# Patient Record
Sex: Female | Born: 1960 | Race: Black or African American | Hispanic: No | Smoking: Never smoker
Health system: Southern US, Community
[De-identification: ages and names within clinical notes are randomized; demographics above are authoritative.]

## PROBLEM LIST (undated history)

## (undated) DIAGNOSIS — F329 Major depressive disorder, single episode, unspecified: Secondary | ICD-10-CM

## (undated) DIAGNOSIS — F419 Anxiety disorder, unspecified: Secondary | ICD-10-CM

## (undated) DIAGNOSIS — N393 Stress incontinence (female) (male): Secondary | ICD-10-CM

## (undated) DIAGNOSIS — F32A Depression, unspecified: Secondary | ICD-10-CM

## (undated) HISTORY — DX: Depression, unspecified: F32.A

## (undated) HISTORY — DX: Anxiety disorder, unspecified: F41.9

## (undated) HISTORY — DX: Stress incontinence (female) (male): N39.3

## (undated) HISTORY — DX: Major depressive disorder, single episode, unspecified: F32.9

---

## 2001-03-28 HISTORY — PX: MYOMECTOMY: SHX85

## 2008-06-20 ENCOUNTER — Ambulatory Visit (HOSPITAL_COMMUNITY): Admission: RE | Admit: 2008-06-20 | Discharge: 2008-06-20 | Payer: Self-pay | Admitting: Obstetrics and Gynecology

## 2009-06-23 ENCOUNTER — Ambulatory Visit (HOSPITAL_COMMUNITY): Admission: RE | Admit: 2009-06-23 | Discharge: 2009-06-23 | Payer: Self-pay | Admitting: Obstetrics and Gynecology

## 2010-08-02 ENCOUNTER — Other Ambulatory Visit (HOSPITAL_COMMUNITY)
Admission: RE | Admit: 2010-08-02 | Discharge: 2010-08-02 | Disposition: A | Discharge status: Home / Self Care | Payer: Managed Care, Other (non HMO) | Source: Ambulatory Visit | Attending: Obstetrics and Gynecology | Admitting: Obstetrics and Gynecology

## 2010-08-02 DIAGNOSIS — Z113 Encounter for screening for infections with a predominantly sexual mode of transmission: Secondary | ICD-10-CM | POA: Insufficient documentation

## 2010-08-02 DIAGNOSIS — Z124 Encounter for screening for malignant neoplasm of cervix: Secondary | ICD-10-CM | POA: Insufficient documentation

## 2011-06-15 ENCOUNTER — Telehealth: Payer: Self-pay | Admitting: Hematology and Oncology

## 2011-06-15 NOTE — Telephone Encounter (Signed)
Called pt re appt w/LO but she was in a meeting and not able to speak with me. Per pt she will call me back tomorrow. Pt was given my direct #.

## 2011-06-17 ENCOUNTER — Telehealth: Payer: Self-pay | Admitting: Hematology and Oncology

## 2011-06-17 NOTE — Telephone Encounter (Signed)
S/w pt re appt for 3/29 @ 9:30 am w/LO.

## 2011-06-21 ENCOUNTER — Telehealth: Payer: Self-pay | Admitting: Hematology and Oncology

## 2011-06-21 NOTE — Telephone Encounter (Signed)
Referred by Dr. Dorothyann Peng Dx- Anemia

## 2011-06-21 NOTE — Telephone Encounter (Signed)
Returned pt's call and r/s 3/29 new pt appt to 4/10 @ 1 pm. D/t per pt request.

## 2011-06-24 ENCOUNTER — Ambulatory Visit: Payer: Managed Care, Other (non HMO)

## 2011-06-24 ENCOUNTER — Ambulatory Visit: Payer: Managed Care, Other (non HMO) | Admitting: Hematology and Oncology

## 2011-07-06 ENCOUNTER — Other Ambulatory Visit: Payer: Self-pay | Admitting: *Deleted

## 2011-07-06 ENCOUNTER — Ambulatory Visit (HOSPITAL_BASED_OUTPATIENT_CLINIC_OR_DEPARTMENT_OTHER): Payer: Managed Care, Other (non HMO) | Admitting: Lab

## 2011-07-06 ENCOUNTER — Encounter: Payer: Self-pay | Admitting: Hematology and Oncology

## 2011-07-06 ENCOUNTER — Ambulatory Visit (HOSPITAL_BASED_OUTPATIENT_CLINIC_OR_DEPARTMENT_OTHER): Payer: Managed Care, Other (non HMO) | Admitting: Hematology and Oncology

## 2011-07-06 ENCOUNTER — Ambulatory Visit: Payer: Managed Care, Other (non HMO)

## 2011-07-06 VITALS — BP 122/75 | HR 75 | Temp 99.8°F | Ht 64.0 in | Wt 145.8 lb

## 2011-07-06 DIAGNOSIS — D539 Nutritional anemia, unspecified: Secondary | ICD-10-CM

## 2011-07-06 DIAGNOSIS — D219 Benign neoplasm of connective and other soft tissue, unspecified: Secondary | ICD-10-CM | POA: Insufficient documentation

## 2011-07-06 DIAGNOSIS — D509 Iron deficiency anemia, unspecified: Secondary | ICD-10-CM

## 2011-07-06 LAB — MORPHOLOGY: PLT EST: ADEQUATE

## 2011-07-06 LAB — COMPREHENSIVE METABOLIC PANEL
ALT: 8 U/L (ref 0–35)
AST: 19 U/L (ref 0–37)
Alkaline Phosphatase: 61 U/L (ref 39–117)
Calcium: 9.3 mg/dL (ref 8.4–10.5)
Chloride: 104 mEq/L (ref 96–112)
Creatinine, Ser: 0.7 mg/dL (ref 0.50–1.10)
Sodium: 139 mEq/L (ref 135–145)
Total Protein: 7.4 g/dL (ref 6.0–8.3)

## 2011-07-06 LAB — URINALYSIS, MICROSCOPIC - CHCC
Nitrite: NEGATIVE
pH: 6 (ref 4.6–8.0)

## 2011-07-06 LAB — CBC & DIFF AND RETIC
BASO%: 1.3 % (ref 0.0–2.0)
EOS%: 1.1 % (ref 0.0–7.0)
HCT: 26.2 % — ABNORMAL LOW (ref 34.8–46.6)
HGB: 7.5 g/dL — ABNORMAL LOW (ref 11.6–15.9)
Immature Retic Fract: 14.4 % — ABNORMAL HIGH (ref 1.60–10.00)
MCHC: 28.6 g/dL — ABNORMAL LOW (ref 31.5–36.0)
MCV: 63.4 fL — ABNORMAL LOW (ref 79.5–101.0)
MONO%: 8.9 % (ref 0.0–14.0)
NEUT#: 1.7 10*3/uL (ref 1.5–6.5)
Platelets: 247 10*3/uL (ref 145–400)
RBC: 4.13 10*6/uL (ref 3.70–5.45)
WBC: 3.7 10*3/uL — ABNORMAL LOW (ref 3.9–10.3)
lymph#: 1.6 10*3/uL (ref 0.9–3.3)

## 2011-07-06 MED ORDER — FE FUM-VIT C-VIT B12-FA 460-60-0.01-1 MG PO CAPS: 1.0000 | ORAL_CAPSULE | Freq: Every day | ORAL | Status: DC

## 2011-07-06 NOTE — Patient Instructions (Signed)
Patient to follow up as instructed.   No current outpatient prescriptions on file.        April 2013  Sunday Monday Tuesday Wednesday Thursday Friday Saturday      1   2   3   4   5   6    7   8   9   10    FINANCIAL COUNSELING   1:00 PM  (30 min.)  [facility]  [facility]   NEW PATIENT 60   1:30 PM  (60 min.)  Laurice Record, MD  [facility] 11   12   13    14   15   16   17   18   19   20    21   22   23   24   25   26   27    28   29    30

## 2011-07-06 NOTE — Progress Notes (Signed)
Dr.   Maggie Font    -      Primary. Dr.   Adron Bene       -      GYN.  [facility]   On    [address].  Cell   Phone       [phone]

## 2011-07-06 NOTE — Progress Notes (Signed)
This office note has been dictated.

## 2011-07-07 LAB — FERRITIN: Ferritin: 3 ng/mL — ABNORMAL LOW (ref 10–291)

## 2011-07-07 LAB — IRON AND TIBC: Iron: <10 ug/dL — ABNORMAL LOW (ref 42–145)

## 2011-07-07 LAB — VITAMIN B12: Vitamin B-12: 481 pg/mL (ref 211–911)

## 2011-07-07 LAB — DIRECT ANTIGLOBULIN TEST (NOT AT ARMC): DAT (Complement): NEGATIVE

## 2011-07-07 NOTE — Progress Notes (Signed)
CC:   Lindsey Cardenas, M.D. Dr. Marrian Salvage  IDENTIFYING STATEMENT:  The patient is a 51 year old woman, seen at the request of Dorothyann Peng with anemia.  HISTORY OF PRESENT ILLNESS:  The patient has recently established primary care with Dr. Allyne Cardenas and was found to be anemic.  The patient noted anemia dating back to 2007 and was found to be iron deficient. She also has a history of menorrhagia secondary to fibroids.  She had received a myomectomy in 2003.  Has been on and off oral iron and is not very consistent with it.  She has never required a blood transfusion. Despite her myomectomy, she continues have heavy menses which she is currently managing through alternative therapies.  She has no rectal bleeding.  She has not noted any bloody stools.  She has not noted any constipation, diarrhea, or recent changes in bowel function.  She has not lost any weight.  She denies hematuria.  Her diet she states is well- balanced.  Review of blood work obtained from Dr. Allyne Cardenas' office on 06/01/2015 notes a white cell count of 5.6, hemoglobin 8.5, hematocrit 18.9, platelets 306.  Ferritin 4, TIBC 561, iron 21, saturation 4.  PAST MEDICAL HISTORY: 1. Uterine fibroids. 2. Status post myomectomy in 2003. 3. History of pregnancy-induced hyperthyroidism. 4. History of false positive hepatitis B.  MEDICATIONS:  None.  ALLERGIES:  Penicillin.  SOCIAL HISTORY:  The patient is divorced with a 36-year-old child. Denies alcohol or tobacco use.  She is a Runner, broadcasting/film/video at [employer]  FAMILY HISTORY:  The patient's father had cancer of the prostate. Paternal grandmother may have had breast cancer.  Mother has anemia.  HEALTH MAINTENANCE:  Receives annual mammograms.  REVIEW OF SYSTEMS:  Denies fever, chills, night sweats, anorexia, pain. GI:  Denies nausea, vomiting, abdominal pain, diarrhea, melena, hematochezia.  GU:  Denies dysuria, hematuria, nocturia, frequency. Cardiovascular:  Denies  chest pain, PND, orthopnea, ankle swelling. Respirations:  Denies cough, hemoptysis, wheeze, shortness of breath. Skin:  No bruising or bleeding.  Musculoskeletal:  Denies joint aches, muscle pains.  Neurologic:  Denies headaches, vision changes, extremity weakness.  Rest of review of systems negative.  PHYSICAL EXAMINATION:  General:  The patient is alert and oriented x3. Vitals:  Pulse 75, blood pressure 122/75, temperature 99.8, respirations 20, weight 145 pounds.  HEENT:  Head is atraumatic, normocephalic. Sclerae anicteric.  Mouth moist without thrush.  Neck:  Supple.  Chest: Clear to percussion and auscultation bilaterally.  CVS:  First and second heart sounds present.  No added sounds or murmurs.  Abdomen: Soft and nontender.  No palpable masses.  No hepatosplenomegaly.  Bowel sounds present.  Extremities:  Reveal no edema.  Pulses present and symmetrical.  Skin:  No bruising.  CNS:  Nonfocal.  IMPRESSION AND PLAN:  Ms. Ikner is a 51 year old woman with iron- deficiency anemia, likely secondary to menorrhagia with history of fibroids.  She is not consistent with oral iron intake.  I have encouraged her to consider doing so and she concurs.  She will be given a prescription for Hematogen Forte with refills.  She has very low iron stores and I think she is a candidate for IV iron.  We will give it to her in the form of INFeD.  We discussed logistics and side effects of therapy, side effects of primary infusional and thus a test dose will be required, followed by premeds prior to the main infusion.  We also discussed the need for obtaining occult  fecal blood testing x3 and a urinalysis.  We will also rule out hemolysis with a Coombs and haptoglobin.  We will repeat a CBC and review morphology and obtain a liver panel.  Also discussed maintaining a well-balanced diet.  The patient wishes to receive iron on 08/02/2011 due to her busy schedule. She was asked that if she has symptoms  of increasing shortness of breath or lightheadedness, she would let Dr. Allyne Cardenas know about this.  She follows up in 6-8 weeks after IV iron.  I spent more than half the time coordinating care.    ______________________________ Laurice Record, M.D. LIO/MEDQ  D:  07/06/2011  T:  07/06/2011  Job:  [id number]

## 2011-07-11 ENCOUNTER — Telehealth: Payer: Self-pay | Admitting: Hematology and Oncology

## 2011-07-11 NOTE — Telephone Encounter (Signed)
pt aware of her 5/7 iron    aom

## 2011-08-01 ENCOUNTER — Telehealth: Payer: Self-pay | Admitting: *Deleted

## 2011-08-01 NOTE — Telephone Encounter (Signed)
Per voice mail message from the patient, she needs to cancel her appt for tomorrow. The patient needs to reschedule her appt. Message left for desk RN to see tomorrow. I have called and spoke with the patient.I have told her that Dr. Dalene Carrow is out of the office today, and we will call her tomorrow to reshedule appt once we spoke with Dr. Dalene Carrow. Message left on desk for RN tomorrow.   JMW

## 2011-08-02 ENCOUNTER — Ambulatory Visit: Payer: Managed Care, Other (non HMO)

## 2011-08-02 ENCOUNTER — Telehealth: Payer: Self-pay | Admitting: *Deleted

## 2011-08-02 NOTE — Telephone Encounter (Signed)
Per message from desk RN ok to move patient's infed treatment to next week. Appt moved and patient aware. JWM

## 2011-08-11 ENCOUNTER — Telehealth: Payer: Self-pay | Admitting: Hematology and Oncology

## 2011-08-11 NOTE — Telephone Encounter (Signed)
Pt lmonvm requesting that 5/17 appt be cx'd due to she is not feeling well and she will call back to r/s when she is feeling better. Message to desk nurse and Marcelino Duster.

## 2011-08-12 ENCOUNTER — Ambulatory Visit: Payer: Managed Care, Other (non HMO)

## 2011-08-12 ENCOUNTER — Telehealth: Payer: Self-pay | Admitting: *Deleted

## 2011-08-12 NOTE — Telephone Encounter (Signed)
Per voice mail message from the patient, she is sick. She canceled her appt for today and will call back to rescheduled. Desk RN notified.  JMW

## 2011-08-12 NOTE — Telephone Encounter (Signed)
Received message from Grandy, infusion scheduler re:  Pt cancelled  Infed infusion for today due to pt is sick.   Called pt at home and left message on voice mail for pt to call office to reschedule appt. Pt's   Phone    [phone].

## 2011-09-12 ENCOUNTER — Other Ambulatory Visit: Payer: Managed Care, Other (non HMO) | Admitting: Lab

## 2011-09-13 ENCOUNTER — Telehealth: Payer: Self-pay | Admitting: *Deleted

## 2011-09-13 NOTE — Telephone Encounter (Signed)
Spoke with pt and was informed that pt wanted to cancel f/u appt with md on 09/14/11.   Pt stated she had not done iron infusion,  Had not done repeated labs.   Pt stated she would like to reschedule iron infusion,  Have repeated labs, and f/u visit with md  Sometimes in  July  Or  August.    Informed pt to let office know when she would like to reschedule , then a scheduler will contact pt with md's instructions.   Pt voiced understanding.

## 2011-09-14 ENCOUNTER — Ambulatory Visit: Payer: Managed Care, Other (non HMO) | Admitting: Hematology and Oncology

## 2011-09-15 ENCOUNTER — Other Ambulatory Visit: Payer: Self-pay | Admitting: *Deleted

## 2011-09-15 ENCOUNTER — Telehealth: Payer: Self-pay | Admitting: *Deleted

## 2011-09-15 DIAGNOSIS — D539 Nutritional anemia, unspecified: Secondary | ICD-10-CM

## 2011-09-15 NOTE — Telephone Encounter (Signed)
Spoke with pt and informed pt re:   Per Dr. Dalene Carrow,  Pt will be scheduled for labs  In July.    MD will review lab results and will make decision whether pt will need iron infusion.   Pt will be contacted with a f/u appt if pt needs iron infusion.   Pt understood that a scheduler will contact pt with lab appt.

## 2011-09-16 ENCOUNTER — Telehealth: Payer: Self-pay | Admitting: Hematology and Oncology

## 2011-09-16 NOTE — Telephone Encounter (Signed)
S/w the pt and she is aware of her lab appt in July. Pt stated she will call me back to double check with her schedule to make sure the d/t works for her

## 2011-10-11 ENCOUNTER — Telehealth: Payer: Self-pay | Admitting: *Deleted

## 2011-10-11 ENCOUNTER — Other Ambulatory Visit: Payer: Managed Care, Other (non HMO) | Admitting: Lab

## 2011-10-11 ENCOUNTER — Other Ambulatory Visit (HOSPITAL_BASED_OUTPATIENT_CLINIC_OR_DEPARTMENT_OTHER): Payer: Managed Care, Other (non HMO) | Admitting: Lab

## 2011-10-11 DIAGNOSIS — D539 Nutritional anemia, unspecified: Secondary | ICD-10-CM

## 2011-10-11 DIAGNOSIS — D509 Iron deficiency anemia, unspecified: Secondary | ICD-10-CM

## 2011-10-11 LAB — IRON AND TIBC
Iron: 13 ug/dL — ABNORMAL LOW (ref 42–145)
TIBC: 435 ug/dL (ref 250–470)
UIBC: 422 ug/dL — ABNORMAL HIGH (ref 125–400)

## 2011-10-11 LAB — CBC WITH DIFFERENTIAL/PLATELET
Basophils Absolute: 0.1 10*3/uL (ref 0.0–0.1)
EOS%: 1.5 % (ref 0.0–7.0)
LYMPH%: 27.9 % (ref 14.0–49.7)
MCHC: 31.8 g/dL (ref 31.5–36.0)
MCV: 72.8 fL — ABNORMAL LOW (ref 79.5–101.0)
RBC: 4.7 10*6/uL (ref 3.70–5.45)
RDW: 20.2 % — ABNORMAL HIGH (ref 11.2–14.5)
WBC: 4.5 10*3/uL (ref 3.9–10.3)
lymph#: 1.2 10*3/uL (ref 0.9–3.3)

## 2011-10-11 LAB — BASIC METABOLIC PANEL
BUN: 11 mg/dL (ref 6–23)
Creatinine, Ser: 0.68 mg/dL (ref 0.50–1.10)
Glucose, Bld: 98 mg/dL (ref 70–99)
Sodium: 141 mEq/L (ref 135–145)

## 2011-10-11 LAB — FERRITIN: Ferritin: 5 ng/mL — ABNORMAL LOW (ref 10–291)

## 2011-10-11 NOTE — Telephone Encounter (Signed)
Spoke with pt on cell phone and gave pt appt date and time for f/u on 10/12/11 at 0915 am.   Pt is also tentatively scheduled for Feraheme infusion as per md.    Pt voiced understanding.

## 2011-10-11 NOTE — Telephone Encounter (Signed)
Spoke with pt on cell phone and informed pt of appt for Feraheme at 1015 am after office visit on 10/12/11.   Pt voiced understanding.

## 2011-10-12 ENCOUNTER — Telehealth: Payer: Self-pay | Admitting: Hematology and Oncology

## 2011-10-12 ENCOUNTER — Ambulatory Visit (HOSPITAL_BASED_OUTPATIENT_CLINIC_OR_DEPARTMENT_OTHER): Payer: Managed Care, Other (non HMO)

## 2011-10-12 ENCOUNTER — Ambulatory Visit (HOSPITAL_BASED_OUTPATIENT_CLINIC_OR_DEPARTMENT_OTHER): Payer: Managed Care, Other (non HMO) | Admitting: Nurse Practitioner

## 2011-10-12 ENCOUNTER — Encounter: Payer: Self-pay | Admitting: Nurse Practitioner

## 2011-10-12 VITALS — BP 115/65 | HR 70 | Temp 98.4°F | Ht 64.0 in | Wt 148.2 lb

## 2011-10-12 VITALS — BP 130/81 | HR 58 | Temp 98.5°F

## 2011-10-12 DIAGNOSIS — R58 Hemorrhage, not elsewhere classified: Secondary | ICD-10-CM

## 2011-10-12 DIAGNOSIS — D509 Iron deficiency anemia, unspecified: Secondary | ICD-10-CM

## 2011-10-12 MED ORDER — SODIUM CHLORIDE 0.9 % IV SOLN
1020.0000 mg | Freq: Once | INTRAVENOUS | Status: AC
  Administered 2011-10-12: 1020 mg via INTRAVENOUS
  Filled 2011-10-12: qty 34

## 2011-10-12 MED ORDER — SODIUM CHLORIDE 0.9 % IV SOLN
Freq: Once | INTRAVENOUS | Status: AC
  Administered 2011-10-12: 11:00:00 via INTRAVENOUS

## 2011-10-12 NOTE — Telephone Encounter (Signed)
appts made and mw will print for pt

## 2011-10-12 NOTE — Progress Notes (Signed)
Lindsey Cardenas cried before her feraheme injection. I do know why.

## 2011-10-12 NOTE — Progress Notes (Signed)
CC:   Robyn N. Allyne Gee, M.D. Dr. Marrian Salvage  IDENTIFYING STATEMENT:  The patient is a 51 year old woman, with iron deficiency anemia due to chronic blood loss.  HISTORY OF PRESENT ILLNESS:  The patient states she missed her appts in May due to a variety of factors, including her schedule and her decision to pursue acupuncture as an alternative treatment. She has grown accustomed to being anemic, and states she does eat a lot of ice, and is tired often and easily but has not had any dizziness. She has had no difficulties passing stool or urine. The patient has anemia dating back to 2007. She also has a history of menorrhagia secondary to fibroids.  She had received a myomectomy in 2003.  Has been on and off oral iron and is not very consistent with it.  She has never required a blood transfusion. Despite her myomectomy, she continues have heavy menses which she is currently managing through alternative therapies.  She has no rectal bleeding.  She has not noted any bloody stools.  She has not noted any constipation, diarrhea, or recent changes in bowel function.  She has not lost any weight.  She denies hematuria.  Her diet she states is well- balanced.  She was placed on prescription Hematogen Forte at her last office visit in April, but she states she has not taken it consistently because it is difficult to coordinate with meals and can make her feel "funny in the head." She asked about other iron supplements without any side effects but unfortunately essentially all of the oral supplements will have some sort of gastrointestinal side effect.     PAST MEDICAL HISTORY: 1. Uterine fibroids. 2. Status post myomectomy in 2003. 3. History of pregnancy-induced hyperthyroidism. 4. History of false positive hepatitis B.  MEDICATIONS:  None.  ALLERGIES:  Penicillin.  SOCIAL HISTORY:  The patient is divorced with a 30-year-old child. Denies alcohol or tobacco use.  She is a Runner, broadcasting/film/video at [employer].  FAMILY HISTORY:  The patient's father had cancer of the prostate. Paternal grandmother may have had breast cancer.  Mother has anemia.  HEALTH MAINTENANCE:  Receives annual mammograms.  REVIEW OF SYSTEMS:  Denies fever, chills, night sweats, anorexia, pain. GI:  Denies nausea, vomiting, abdominal pain, diarrhea, melena, hematochezia.  GU:  Denies dysuria, hematuria, nocturia, frequency. Cardiovascular:  Denies chest pain, PND, orthopnea, ankle swelling. Respirations:  Denies cough, hemoptysis, wheeze, shortness of breath. Skin:  No bruising or bleeding.  Musculoskeletal:  Denies joint aches, muscle pains.  Neurologic:  Denies headaches, vision changes, extremity weakness.  Rest of review of systems negative.  PHYSICAL EXAMINATION:  General:  The patient is alert and oriented x3. Vitals:  Filed Vitals:   10/12/11 0941  BP: 115/65  Pulse: 70  Temp: 98.4 F (36.9 C)    HEENT:  Head is atraumatic, normocephalic. Sclerae anicteric.  Mouth moist without thrush.  Neck:  Supple.  Chest: Clear to percussion and auscultation bilaterally.  CVS:  First and second heart sounds present.  No added sounds or murmurs.  Abdomen: Soft and nontender.  No palpable masses.  No hepatosplenomegaly.  Bowel sounds present.  Extremities:  Reveal no edema.  Pulses present and symmetrical.  Skin:  No bruising.  CNS:  Nonfocal.  LABS: WBC  Date/Time Value Range Status  10/11/2011  9:00 AM 4.5  3.9 - 10.3 10e3/uL Final     HGB  Date/Time Value Range Status  10/11/2011  9:00 AM 10.9* 11.6 - 15.9  g/dL Final     HCT  Date/Time Value Range Status  10/11/2011  9:00 AM 34.2* 34.8 - 46.6 % Final     Platelets  Date/Time Value Range Status  10/11/2011  9:00 AM 212  145 - 400 10e3/uL Final     Iron  Date/Time Value Range Status  10/11/2011  9:00 AM 13* 42 - 145 ug/dL Final     UIBC  Date/Time Value Range Status  10/11/2011  9:00 AM 422* 125 - 400 ug/dL Final     TIBC  Date/Time Value  Range Status  10/11/2011  9:00 AM 435  250 - 470 ug/dL Final   Ferritin  Date/Time Value Range Status  10/11/2011  9:00 AM 5* 10 - 291 ng/mL Final      IMPRESSION AND PLAN:  Ms. Caldwell is a 51 year old woman with iron- deficiency anemia, likely secondary to menorrhagia with history of fibroids. She will receive Feraheme today, and she was given a copy of her labs. She is not consistent with oral iron intake. We reviewed numerous options for oral supplementation and she prefers to increase her intake of high iron foods instead of trying additional supplements. She was strongly encouraged to incorporate high iron foods into her daily diet, (spinach, liver, red meat) - she is interested in alternative treatments and has had acupuncture. We encouraged herWe will see her back in November, with labs a few days prior to her scheduled office visit.   ______________________________ Bobbe Medico, NP-C, AOCNP

## 2011-10-12 NOTE — Patient Instructions (Addendum)
Cancer Center Discharge Instructions for Patients Receiving Chemotherapy  Today you received the following chemotherapy agents Feraheme  To help prevent nausea and vomiting after your treatment, we encourage you to take your nausea medication Begin taking it at 7 pm and take it as often as prescribed for the next 24 to 72 hours.   If you develop nausea and vomiting that is not controlled by your nausea medication, call the clinic. If it is after clinic hours your family physician or the after hours number for the clinic or go to the Emergency Department.   BELOW ARE SYMPTOMS THAT SHOULD BE REPORTED IMMEDIATELY:  *FEVER GREATER THAN 100.5 F  *CHILLS WITH OR WITHOUT FEVER  NAUSEA AND VOMITING THAT IS NOT CONTROLLED WITH YOUR NAUSEA MEDICATION  *UNUSUAL SHORTNESS OF BREATH  *UNUSUAL BRUISING OR BLEEDING  TENDERNESS IN MOUTH AND THROAT WITH OR WITHOUT PRESENCE OF ULCERS  *URINARY PROBLEMS  *BOWEL PROBLEMS  UNUSUAL RASH Items with * indicate a potential emergency and should be followed up as soon as possible.  One of the nurses will contact you 24 hours after your treatment. Please let the nurse know about any problems that you may have experienced. Feel free to call the clinic you have any questions or concerns. The clinic phone number is [phone].   I have been informed and understand all the instructions given to me. I know to contact the clinic, my physician, or go to the Emergency Department if any problems should occur. I do not have any questions at this time, but understand that I may call the clinic during office hours   should I have any questions or need assistance in obtaining follow up care.    __________________________________________  _____________  __________ Signature of Patient or Authorized Representative            Date                   Time    __________________________________________ Nurse's Signature    

## 2011-10-13 ENCOUNTER — Telehealth: Payer: Self-pay | Admitting: *Deleted

## 2011-10-13 NOTE — Telephone Encounter (Signed)
Message copied by Augusto Garbe on Thu Oct 13, 2011  1:09 PM ------      Message from: Melton Krebs D      Created: Wed Oct 12, 2011 11:18 AM      Regarding: 1st Feraheme; Patient of Dr. Aquilla Solian: [phone]       I am forwarding Mrs. Canino information as she may need a follow up call due to the fact that she was a 1st time Feraheme injection patient. She is a patient of Dr. Dalene Carrow.

## 2011-10-13 NOTE — Telephone Encounter (Signed)
Called three numbers and unable to reach patient.  Calling for f/u after first feraheme dose.  Message left requesting a return call.

## 2011-10-24 ENCOUNTER — Other Ambulatory Visit (HOSPITAL_COMMUNITY)
Admission: RE | Admit: 2011-10-24 | Discharge: 2011-10-24 | Disposition: A | Discharge status: Home / Self Care | Payer: Managed Care, Other (non HMO) | Source: Ambulatory Visit | Attending: Obstetrics and Gynecology | Admitting: Obstetrics and Gynecology

## 2011-10-24 ENCOUNTER — Other Ambulatory Visit: Payer: Self-pay | Admitting: Obstetrics and Gynecology

## 2011-10-24 DIAGNOSIS — Z01419 Encounter for gynecological examination (general) (routine) without abnormal findings: Secondary | ICD-10-CM | POA: Insufficient documentation

## 2011-10-24 DIAGNOSIS — Z1151 Encounter for screening for human papillomavirus (HPV): Secondary | ICD-10-CM | POA: Insufficient documentation

## 2012-02-10 ENCOUNTER — Other Ambulatory Visit (HOSPITAL_BASED_OUTPATIENT_CLINIC_OR_DEPARTMENT_OTHER): Payer: Managed Care, Other (non HMO) | Admitting: Lab

## 2012-02-10 DIAGNOSIS — D539 Nutritional anemia, unspecified: Secondary | ICD-10-CM

## 2012-02-10 DIAGNOSIS — D509 Iron deficiency anemia, unspecified: Secondary | ICD-10-CM

## 2012-02-10 LAB — IRON AND TIBC
Iron: 21 ug/dL — ABNORMAL LOW (ref 42–145)
UIBC: 375 ug/dL (ref 125–400)

## 2012-02-10 LAB — BASIC METABOLIC PANEL (CC13)
Calcium: 9.2 mg/dL (ref 8.4–10.4)
Potassium: 3.6 mEq/L (ref 3.5–5.1)
Sodium: 140 mEq/L (ref 136–145)

## 2012-02-10 LAB — CBC WITH DIFFERENTIAL/PLATELET
BASO%: 0.5 % (ref 0.0–2.0)
Basophils Absolute: 0 10*3/uL (ref 0.0–0.1)
EOS%: 0.7 % (ref 0.0–7.0)
HCT: 32.4 % — ABNORMAL LOW (ref 34.8–46.6)
HGB: 10.3 g/dL — ABNORMAL LOW (ref 11.6–15.9)
MCH: 24 pg — ABNORMAL LOW (ref 25.1–34.0)
MCHC: 31.8 g/dL (ref 31.5–36.0)
MONO#: 0.6 10*3/uL (ref 0.1–0.9)
NEUT%: 56.5 % (ref 38.4–76.8)
RDW: 14.4 % (ref 11.2–14.5)
WBC: 5.7 10*3/uL (ref 3.9–10.3)
lymph#: 1.8 10*3/uL (ref 0.9–3.3)

## 2012-02-10 LAB — FERRITIN: Ferritin: 4 ng/mL — ABNORMAL LOW (ref 10–291)

## 2012-02-15 ENCOUNTER — Encounter: Payer: Self-pay | Admitting: Hematology and Oncology

## 2012-02-15 ENCOUNTER — Telehealth: Payer: Self-pay | Admitting: *Deleted

## 2012-02-15 ENCOUNTER — Ambulatory Visit (HOSPITAL_BASED_OUTPATIENT_CLINIC_OR_DEPARTMENT_OTHER): Payer: Managed Care, Other (non HMO)

## 2012-02-15 ENCOUNTER — Ambulatory Visit (HOSPITAL_BASED_OUTPATIENT_CLINIC_OR_DEPARTMENT_OTHER): Payer: Managed Care, Other (non HMO) | Admitting: Hematology and Oncology

## 2012-02-15 ENCOUNTER — Telehealth: Payer: Self-pay | Admitting: Hematology and Oncology

## 2012-02-15 VITALS — BP 111/61 | HR 81 | Temp 98.1°F | Resp 18 | Ht 64.0 in | Wt 154.8 lb

## 2012-02-15 DIAGNOSIS — D509 Iron deficiency anemia, unspecified: Secondary | ICD-10-CM

## 2012-02-15 DIAGNOSIS — D539 Nutritional anemia, unspecified: Secondary | ICD-10-CM

## 2012-02-15 DIAGNOSIS — D259 Leiomyoma of uterus, unspecified: Secondary | ICD-10-CM

## 2012-02-15 DIAGNOSIS — N920 Excessive and frequent menstruation with regular cycle: Secondary | ICD-10-CM

## 2012-02-15 MED ORDER — SODIUM CHLORIDE 0.9 % IV SOLN
1020.0000 mg | Freq: Once | INTRAVENOUS | Status: AC
  Administered 2012-02-15: 1020 mg via INTRAVENOUS
  Filled 2012-02-15: qty 34

## 2012-02-15 NOTE — Patient Instructions (Addendum)
Ferumoxytol injection What is this medicine? FERUMOXYTOL is an iron complex. Iron is used to make healthy red blood cells, which carry oxygen and nutrients throughout the body. This medicine is used to treat iron deficiency anemia in people with chronic kidney disease. This medicine may be used for other purposes; ask your health care provider or pharmacist if you have questions. What should I tell my health care provider before I take this medicine? They need to know if you have any of these conditions: -anemia not caused by low iron levels -high levels of iron in the blood -magnetic resonance imaging (MRI) test scheduled -an unusual or allergic reaction to iron, other medicines, foods, dyes, or preservatives -pregnant or trying to get pregnant -breast-feeding How should I use this medicine? This medicine is for infusion into a vein. It is given by a health care professional in a hospital or clinic setting. Talk to your pediatrician regarding the use of this medicine in children. Special care may be needed. Overdosage: If you think you've taken too much of this medicine contact a poison control center or emergency room at once. Overdosage: If you think you have taken too much of this medicine contact a poison control center or emergency room at once. NOTE: This medicine is only for you. Do not share this medicine with others. What if I miss a dose? It is important not to miss your dose. Call your doctor or health care professional if you are unable to keep an appointment. What may interact with this medicine? This medicine may interact with the following medications: -other iron products This list may not describe all possible interactions. Give your health care provider a list of all the medicines, herbs, non-prescription drugs, or dietary supplements you use. Also tell them if you smoke, drink alcohol, or use illegal drugs. Some items may interact with your medicine. What should I watch  for while using this medicine? Visit your doctor or healthcare professional regularly. Tell your doctor or healthcare professional if your symptoms do not start to get better or if they get worse. You may need blood work done while you are taking this medicine. You may need to follow a special diet. Talk to your doctor. Foods that contain iron include: whole grains/cereals, dried fruits, beans, or peas, leafy green vegetables, and organ meats (liver, kidney). What side effects may I notice from receiving this medicine? Side effects that you should report to your doctor or health care professional as soon as possible: -allergic reactions like skin rash, itching or hives, swelling of the face, lips, or tongue -breathing problems -changes in blood pressure -feeling faint or lightheaded, falls -fever or chills -flushing, sweating, or hot feelings -swelling of the ankles or feet Side effects that usually do not require medical attention (Report these to your doctor or health care professional if they continue or are bothersome.): -diarrhea -headache -nausea, vomiting -stomach pain This list may not describe all possible side effects. Call your doctor for medical advice about side effects. You may report side effects to FDA at 1-800-FDA-1088. Where should I keep my medicine? This drug is given in a hospital or clinic and will not be stored at home. NOTE: This sheet is a summary. It may not cover all possible information. If you have questions about this medicine, talk to your doctor, pharmacist, or health care provider.  2013, [employer]vier/[employer] Standard. (12/05/2007 9:48:25 PM)  

## 2012-02-15 NOTE — Patient Instructions (Addendum)
Lindsey Cardenas  [id number]   [facility] - AFTER VISIT SUMMARY   **RECOMMENDATIONS MADE BY THE CONSULTANT AND ANY TEST    RESULTS WILL BE SENT TO YOUR REFERRING DOCTORS.   YOUR EXAM FINDINGS, LABS AND RESULTS WERE DISCUSSED BY YOUR MD TODAY.  YOU CAN GO TO THE [facility] WEB SITE FOR INSTRUCTIONS ON HOW TO ASSESS MY CHART FOR ADDITIONAL INFORMATION AS NEEDED.  Your Updated drug allergies are: Allergies as of 02/15/2012 - Review Complete 10/12/2011  Allergen Reaction Noted  . Penicillins Hives 07/06/2011    Your current list of medications are: Current Outpatient Prescriptions  Medication Sig Dispense Refill  . Fe Fum-Vit C-Vit B12-FA (TRIGELS-F) 460-60-0.01-1 MG CAPS Take 1 capsule by mouth daily.  90 capsule  3     INSTRUCTIONS GIVEN AND DISCUSSED:  See attached schedule   SPECIAL INSTRUCTIONS/FOLLOW-UP:  See above.  I acknowledge that I have been informed and understand all the instructions given to me and received a copy.I know to contact the clinic, my physician, or go to the emergency Department if any problems should occur.   I do not have any more questions at this time, but understand that I may call the [facility] at [phone] during business hours should I have any further questions or need assistance in obtaining follow-up care.

## 2012-02-15 NOTE — Telephone Encounter (Signed)
Per staff message and POF i have scheduled appt. JMW

## 2012-02-15 NOTE — Progress Notes (Signed)
This office note has been dictated.

## 2012-02-15 NOTE — Telephone Encounter (Signed)
appts made and printed for pt pt aware that iron will follow on 2/26,email to mw to add    aom

## 2012-02-16 NOTE — Progress Notes (Signed)
CC:   Lindsey Cardenas, M.D.  IDENTIFYING STATEMENT:  The patient is a 51 year old woman with a history of anemia who presents for followup.  INTERVAL HISTORY:  The patient has iron-deficiency anemia second to menorrhagia and has fibroids.  She last received iron in the form of Feraheme on 10/12/2011.  She continues to have heavy menses.  Is still not consistent with oral iron intake.  Currently, is not short of breath.  Energy levels are somewhat fair.  MEDICATIONS:  Reviewed and updated.  ALLERGIES:  Penicillin.  PHYSICAL EXAM:  General:  Patient is alert and oriented x3.  Vitals: Pulse 81, blood pressure 111/61, temperature 98.1, respirations 18, weight 154 pounds.  HEENT:  Head is atraumatic, normocephalic.  Sclerae anicteric.  Mouth moist.  Chest:  Clear.  Abdomen:  Soft, nontender.  No masses.  Bowel sounds present.  Extremities:  No edema.  LAB DATA:  02/10/2012 white cell count 5.7, hemoglobin 10.3, hematocrit 32.4, platelets 235.  Iron 21, TIBC 296, ferritin 4.  Sodium 140, potassium 3.6, chloride 109, CO2 23, BUN 16, creatinine 0.7, glucose 91.  IMPRESSION AND PLAN:  Mrs. Timp is a 51 year old woman with iron- deficiency anemia secondary to menorrhagia with fibroids.  Her ferritin levels remain low due to ongoing bleeding.  She will receive Feraheme and follow up in a few months' time. She was reminded to be consistent With prescription iron intake.     ______________________________ Laurice Record, M.D. LIO/MEDQ  D:  02/15/2012  T:  02/15/2012  Job:  [id number]

## 2012-03-31 ENCOUNTER — Telehealth: Payer: Self-pay | Admitting: Oncology

## 2012-03-31 ENCOUNTER — Encounter: Payer: Self-pay | Admitting: Oncology

## 2012-03-31 NOTE — Telephone Encounter (Signed)
lvm for pt to call back Monday.Marland KitchenMarland KitchenMarland KitchenMarland Kitchenprinted appt schedule and letter to be mailed....former pt of Dr. Marton Redwood assigned to Dr. Reece Agar.

## 2012-05-18 ENCOUNTER — Other Ambulatory Visit: Payer: Self-pay | Admitting: Nurse Practitioner

## 2012-05-18 ENCOUNTER — Other Ambulatory Visit: Payer: Managed Care, Other (non HMO)

## 2012-05-18 DIAGNOSIS — D539 Nutritional anemia, unspecified: Secondary | ICD-10-CM

## 2012-05-23 ENCOUNTER — Ambulatory Visit: Payer: Managed Care, Other (non HMO) | Admitting: Hematology and Oncology

## 2012-05-23 ENCOUNTER — Ambulatory Visit: Payer: Managed Care, Other (non HMO)

## 2012-05-24 ENCOUNTER — Telehealth: Payer: Self-pay | Admitting: Oncology

## 2012-05-24 NOTE — Telephone Encounter (Signed)
Returned pt's call re cx 2/28 appts. Pt does not wish to r/s and will call us if she needs [place]. Former LO pt reassigned to [employer].

## 2012-05-25 ENCOUNTER — Ambulatory Visit: Payer: Managed Care, Other (non HMO)

## 2012-05-25 ENCOUNTER — Ambulatory Visit: Payer: Managed Care, Other (non HMO) | Admitting: Nurse Practitioner

## 2012-08-08 ENCOUNTER — Other Ambulatory Visit: Payer: Self-pay | Admitting: Obstetrics and Gynecology

## 2012-08-08 DIAGNOSIS — Z1231 Encounter for screening mammogram for malignant neoplasm of breast: Secondary | ICD-10-CM

## 2012-08-17 ENCOUNTER — Ambulatory Visit (HOSPITAL_COMMUNITY)
Admission: RE | Admit: 2012-08-17 | Discharge: 2012-08-17 | Disposition: A | Discharge status: Home / Self Care | Payer: BC Managed Care – PPO | Source: Ambulatory Visit | Attending: Obstetrics and Gynecology | Admitting: Obstetrics and Gynecology

## 2012-08-17 DIAGNOSIS — Z1231 Encounter for screening mammogram for malignant neoplasm of breast: Secondary | ICD-10-CM | POA: Insufficient documentation

## 2013-03-12 ENCOUNTER — Other Ambulatory Visit (HOSPITAL_COMMUNITY)
Admission: RE | Admit: 2013-03-12 | Discharge: 2013-03-12 | Disposition: A | Discharge status: Home / Self Care | Payer: BC Managed Care – PPO | Source: Ambulatory Visit | Attending: Obstetrics and Gynecology | Admitting: Obstetrics and Gynecology

## 2013-03-12 ENCOUNTER — Other Ambulatory Visit: Payer: Self-pay | Admitting: Obstetrics and Gynecology

## 2013-03-12 DIAGNOSIS — Z01419 Encounter for gynecological examination (general) (routine) without abnormal findings: Secondary | ICD-10-CM | POA: Insufficient documentation

## 2013-03-14 ENCOUNTER — Other Ambulatory Visit: Payer: Self-pay | Admitting: Obstetrics and Gynecology

## 2013-03-14 DIAGNOSIS — E010 Iodine-deficiency related diffuse (endemic) goiter: Secondary | ICD-10-CM

## 2013-03-18 ENCOUNTER — Other Ambulatory Visit: Payer: Managed Care, Other (non HMO)

## 2013-07-08 ENCOUNTER — Encounter (HOSPITAL_COMMUNITY): Payer: Self-pay | Admitting: Emergency Medicine

## 2013-07-08 ENCOUNTER — Emergency Department (HOSPITAL_COMMUNITY)
Admission: EM | Admit: 2013-07-08 | Discharge: 2013-07-08 | Disposition: A | Discharge status: Home / Self Care | Payer: BC Managed Care – PPO | Source: Home / Self Care | Attending: Emergency Medicine | Admitting: Emergency Medicine

## 2013-07-08 DIAGNOSIS — IMO0002 Reserved for concepts with insufficient information to code with codable children: Secondary | ICD-10-CM

## 2013-07-08 DIAGNOSIS — J3489 Other specified disorders of nose and nasal sinuses: Secondary | ICD-10-CM

## 2013-07-08 DIAGNOSIS — S199XXA Unspecified injury of neck, initial encounter: Secondary | ICD-10-CM

## 2013-07-08 DIAGNOSIS — S0993XA Unspecified injury of face, initial encounter: Secondary | ICD-10-CM

## 2013-07-08 DIAGNOSIS — H538 Other visual disturbances: Secondary | ICD-10-CM

## 2013-07-08 DIAGNOSIS — Y929 Unspecified place or not applicable: Secondary | ICD-10-CM

## 2013-07-08 NOTE — ED Provider Notes (Signed)
Medical screening examination/treatment/procedure(s) were performed by non-physician practitioner and as supervising physician I was immediately available for consultation/collaboration.  Philipp Deputy, M.D.  Harden Mo, MD 07/08/13 [phone]

## 2013-07-08 NOTE — Discharge Instructions (Signed)
Contusion A contusion is a deep bruise. Contusions are the result of an injury that caused bleeding under the skin. The contusion may turn blue, purple, or yellow. Minor injuries will give you a painless contusion, but more severe contusions may stay painful and swollen for a few weeks.  CAUSES  A contusion is usually caused by a blow, trauma, or direct force to an area of the body. SYMPTOMS   Swelling and redness of the injured area.  Bruising of the injured area.  Tenderness and soreness of the injured area.  Pain. DIAGNOSIS  The diagnosis can be made by taking a history and physical exam. An X-ray, CT scan, or MRI may be needed to determine if there were any associated injuries, such as fractures. TREATMENT  Specific treatment will depend on what area of the body was injured. In general, the best treatment for a contusion is resting, icing, elevating, and applying cold compresses to the injured area. Over-the-counter medicines may also be recommended for pain control. Ask your caregiver what the best treatment is for your contusion. HOME CARE INSTRUCTIONS   Put ice on the injured area.  Put ice in a plastic bag.  Place a towel between your skin and the bag.  Leave the ice on for 15-20 minutes, 03-04 times a day.  Only take over-the-counter or prescription medicines for pain, discomfort, or fever as directed by your caregiver. Your caregiver may recommend avoiding anti-inflammatory medicines (aspirin, ibuprofen, and naproxen) for 48 hours because these medicines may increase bruising.  Rest the injured area.  If possible, elevate the injured area to reduce swelling. SEEK IMMEDIATE MEDICAL CARE IF:   You have increased bruising or swelling.  You have pain that is getting worse.  Your swelling or pain is not relieved with medicines. MAKE SURE YOU:   Understand these instructions.  Will watch your condition.  Will get help right away if you are not doing well or get  worse. Document Released: 12/22/2004 Document Revised: 06/06/2011 Document Reviewed: 01/17/2011 [facility] Patient Information 2014 [place], [place].  Head Injury, Adult You have received a head injury. It does not appear serious at this time. Headaches and vomiting are common following head injury. It should be easy to awaken from sleeping. Sometimes it is necessary for you to stay in the emergency department for a while for observation. Sometimes admission to the hospital may be needed. After injuries such as yours, most problems occur within the first 24 hours, but side effects may occur up to 7 10 days after the injury. It is important for you to carefully monitor your condition and contact your health care provider or seek immediate medical care if there is a change in your condition. WHAT ARE THE TYPES OF HEAD INJURIES? Head injuries can be as minor as a bump. Some head injuries can be more severe. More severe head injuries include:  A jarring injury to the brain (concussion).  A bruise of the brain (contusion). This mean there is bleeding in the brain that can cause swelling.  A cracked skull (skull fracture).  Bleeding in the brain that collects, clots, and forms a bump (hematoma). WHAT CAUSES A HEAD INJURY? A serious head injury is most likely to happen to someone who is in a car wreck and is not wearing a seat belt. Other causes of major head injuries include bicycle or motorcycle accidents, sports injuries, and falls. HOW ARE HEAD INJURIES DIAGNOSED? A complete history of the event leading to the injury and your  current symptoms will be helpful in diagnosing head injuries. Many times, pictures of the brain, such as CT or MRI are needed to see the extent of the injury. Often, an overnight hospital stay is necessary for observation.  WHEN SHOULD I SEEK IMMEDIATE MEDICAL CARE?  You should get help right away if:  You have confusion or drowsiness.  You feel sick to your stomach  (nauseous) or have continued, forceful vomiting.  You have dizziness or unsteadiness that is getting worse.  You have severe, continued headaches not relieved by medicine. Only take over-the-counter or prescription medicines for pain, fever, or discomfort as directed by your health care provider.  You do not have normal function of the arms or legs or are unable to walk.  You notice changes in the black spots in the center of the colored part of your eye (pupil).  You have a clear or bloody fluid coming from your nose or ears.  You have a loss of vision. During the next 24 hours after the injury, you must stay with someone who can watch you for the warning signs. This person should contact local emergency services (911 in the [place] if you have seizures, you become unconscious, or you are unable to wake up. HOW CAN I PREVENT A HEAD INJURY IN THE FUTURE? The most important factor for preventing major head injuries is avoiding motor vehicle accidents. To minimize the potential for damage to your head, it is crucial to wear seat belts while riding in motor vehicles. Wearing helmets while bike riding and playing collision sports (like football) is also helpful. Also, avoiding dangerous activities around the house will further help reduce your risk of head injury.  WHEN CAN I RETURN TO NORMAL ACTIVITIES AND ATHLETICS? You should be reevaluated by your health care provider before returning to these activities. If you have any of the following symptoms, you should not return to activities or contact sports until 1 week after the symptoms have stopped:  Persistent headache.  Dizziness or vertigo.  Poor attention and concentration.  Confusion.  Memory problems.  Nausea or vomiting.  Fatigue or tire easily.  Irritability.  Intolerant of bright lights or loud noises.  Anxiety or depression.  Disturbed sleep. MAKE SURE YOU:   Understand these instructions.  Will watch your  condition.  Will get help right away if you are not doing well or get worse. Document Released: 03/14/2005 Document Revised: 01/02/2013 Document Reviewed: 11/19/2012 [facility] Patient Information 2014 [place].  Visual Disturbances You have had a disturbance in your vision. This may be caused by various conditions, such as:  Migraines. Migraine headaches are often preceded by a disturbance in vision. Blind spots or light flashes are followed by a headache. This type of visual disturbance is temporary. It does not damage the eye.  Glaucoma. This is caused by increased pressure in the eye. Symptoms include haziness, blurred vision, or seeing rainbow colored circles when looking at bright lights. Partial or complete visual loss can occur. You may or may not experience eye pain. Visual loss may be gradual or sudden and is irreversible. Glaucoma is the leading cause of blindness.  Retina problems. Vision will be reduced if the retina becomes detached or if there is a circulation problem as with diabetes, high blood pressure, or a mini-stroke. Symptoms include seeing "floaters," flashes of light, or shadows, as if a curtain has fallen over your eye.  Optic nerve problems. The main nerve in your eye can be damaged by redness,  soreness, and swelling (inflammation), poor circulation, drugs, and toxins. It is very important to have a complete exam done by a specialist to determine the exact cause of your eye problem. The specialist may recommend medicines or surgery, depending on the cause of the problem. This can help prevent further loss of vision or reduce the risk of having a stroke. Contact the caregiver to whom you have been referred and arrange for follow-up care right away. SEEK IMMEDIATE MEDICAL CARE IF:   Your vision gets worse.  You develop severe headaches.  You have any weakness or numbness in the face, arms, or legs.  You have any trouble speaking or walking. Document Released:  04/21/2004 Document Revised: 06/06/2011 Document Reviewed: 08/12/2009 [facility] Patient Information 2014 [place].

## 2013-07-08 NOTE — ED Notes (Signed)
Reports around 1:30 Sunday evening hit self in face/nose.   C/o still having pain but has not tried any otc pain meds.  Dizzy.  Blurred vision.    States "dont feel right".   Denies n/v

## 2013-07-08 NOTE — ED Provider Notes (Signed)
3CSN: [id number]     Arrival date & time 07/08/13  2706 History   First MD Initiated Contact with Patient 07/08/13 0848     Chief Complaint  Patient presents with  . Facial Pain    facial injury.   (Consider location/radiation/quality/duration/timing/severity/associated sxs/prior Treatment) HPI Comments: 8f presents c/o headache and blurry vision after hitting her face on a car door on Saturday.  She opened the car door into her face.  She immediately had pain in her nose where the door hit, this has gotten gradually better.  She says she has had blurry vision since then.  Also headache in the front of her head.    She eventually tells me that she has had blurry vision for years, she has been worked up by her PCP and by an ophthalmologist, told nothing was wrong.  Vision is unchanged since the incident.     History reviewed. No pertinent past medical history. History reviewed. No pertinent past surgical history. History reviewed. No pertinent family history. History  Substance Use Topics  . Smoking status: Never Smoker   . Smokeless tobacco: Not on file  . Alcohol Use: Yes   OB History   Grav Para Term Preterm Abortions TAB SAB Ect Mult Living                 Review of Systems  HENT:       Nose pain  Eyes: Positive for visual disturbance.  Gastrointestinal: Negative for nausea and vomiting.  Neurological: Positive for headaches.  All other systems reviewed and are negative.   Allergies  Penicillins  Home Medications   Current Outpatient Rx  Name  Route  Sig  Dispense  Refill  . Fe Fum-Vit C-Vit B12-FA (TRIGELS-F FORTE) 460-60-0.01-1 MG CAPS   Oral   Take 1 capsule by mouth daily.          BP 128/81  Pulse 68  Temp(Src) 98.6 F (37 C) (Oral)  Resp 20  SpO2 100%  LMP 06/13/2013 Physical Exam  Nursing note and vitals reviewed. Constitutional: She is oriented to person, place, and time. Vital signs are normal. She appears well-developed and well-nourished. No  distress.  HENT:  Head: Normocephalic and atraumatic.  Right Ear: Tympanic membrane, external ear and ear canal normal. No hemotympanum.  Left Ear: Tympanic membrane, external ear and ear canal normal. No hemotympanum.  Nose: Nose normal.  Minimal tenderness on the bridge of the nose with no objective abnormalities   Eyes: Conjunctivae and EOM are normal. Pupils are equal, round, and reactive to light. Right eye exhibits no discharge. Left eye exhibits no discharge.  Fundoscopic exam:      The right eye shows no hemorrhage and no papilledema. The right eye shows red reflex.       The left eye shows no hemorrhage and no papilledema. The left eye shows red reflex.  Cardiovascular: Normal rate, regular rhythm and normal heart sounds.   Pulmonary/Chest: Effort normal and breath sounds normal. No respiratory distress.  Neurological: She is alert and oriented to person, place, and time. She has normal strength and normal reflexes. She is not disoriented. She displays no tremor. No cranial nerve deficit or sensory deficit. She exhibits normal muscle tone. She displays a negative Romberg sign. Coordination and gait normal. GCS eye subscore is 4. GCS verbal subscore is 5. GCS motor subscore is 6.  Skin: Skin is warm and dry. No rash noted. She is not diaphoretic.  Psychiatric: She has a normal  mood and affect. Judgment normal.    ED Course  Procedures (including critical care time) Labs Review Labs Reviewed - No data to display Imaging Review No results found.   MDM   1. Facial trauma   2. Nose pain   3. Blurry vision    Detailed neuro exam is normal.  Vision is 20/20 in the right eye and 20/25 in the left.  She has no risk factors for bleed.  Blurry vision is chronic, not new like she initially told me.  She then admits that she is convinced she has a brain tumor even though her personal physician and her ophthalmologist have told her she does not.  She will f/u with her PCP.  Ibuprofen PRN  for pain     Liam Graham, PA-C 07/08/13 Emanuel Mahari Vankirk, PA-C 07/08/13 [phone]

## 2013-09-02 ENCOUNTER — Other Ambulatory Visit: Payer: Self-pay | Admitting: Obstetrics and Gynecology

## 2013-09-02 DIAGNOSIS — Z1231 Encounter for screening mammogram for malignant neoplasm of breast: Secondary | ICD-10-CM

## 2013-09-03 ENCOUNTER — Ambulatory Visit (HOSPITAL_COMMUNITY)
Admission: RE | Admit: 2013-09-03 | Discharge: 2013-09-03 | Disposition: A | Discharge status: Home / Self Care | Payer: BC Managed Care – PPO | Source: Ambulatory Visit | Attending: Obstetrics and Gynecology | Admitting: Obstetrics and Gynecology

## 2013-09-03 DIAGNOSIS — Z803 Family history of malignant neoplasm of breast: Secondary | ICD-10-CM | POA: Insufficient documentation

## 2013-09-03 DIAGNOSIS — Z1231 Encounter for screening mammogram for malignant neoplasm of breast: Secondary | ICD-10-CM

## 2014-01-08 ENCOUNTER — Encounter: Payer: Self-pay | Admitting: *Deleted

## 2014-01-09 ENCOUNTER — Encounter (INDEPENDENT_AMBULATORY_CARE_PROVIDER_SITE_OTHER): Payer: Self-pay

## 2014-01-09 ENCOUNTER — Encounter: Payer: Self-pay | Admitting: Neurology

## 2014-01-09 ENCOUNTER — Ambulatory Visit (INDEPENDENT_AMBULATORY_CARE_PROVIDER_SITE_OTHER): Payer: BC Managed Care – PPO | Admitting: Neurology

## 2014-01-09 VITALS — BP 114/70 | HR 71 | Temp 99.1°F | Ht 64.0 in | Wt 171.0 lb

## 2014-01-09 DIAGNOSIS — R200 Anesthesia of skin: Secondary | ICD-10-CM

## 2014-01-09 DIAGNOSIS — H538 Other visual disturbances: Secondary | ICD-10-CM

## 2014-01-09 NOTE — Patient Instructions (Signed)
We will do a brain MRI w and wo contrast for diagnostic testing.   Your exam looks benign today.   I can see you back as needed.

## 2014-01-09 NOTE — Progress Notes (Signed)
Subjective:    Patient ID: Lindsey Cardenas is a 53 y.o. female.  HPI    Star Age, MD, PhD [facility] [address] [place], [place] [address]  Dear Dr. Shelia Media,  I saw your patient, Lindsey Cardenas, upon your kind request in my neurologic clinic today for initial consultation of her blurry vision and episodic numbness. The patient is unaccompanied today. 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 She has had workup with an ophthalmologist and was told her vision was fine. Of note, she presented to the emergency room on 07/08/2013 after she hit her car door into her face and was complaining of blurry vision at the time. The emergency room records were reviewed. She reported unchanged blurry vision for years at the time. She was noted to have good visual acuity bilaterally. She reported being worked up for blurry vision by her primary care provider and ophthalmology and reported that she was worried she may have a brain tumor. She reports an over 2 year history of intermittent blurry vision, particularly with prolonged reading. She denies visual field deficits or double vision. She denies vertigo, hearing loss, ringing in her ears, recurrent headaches. She has intermittent numbness particularly in the ulnar aspect of both arms when she places her elbows down for a prolonged period of time. She may get the tingling and numbness sensation from the elbows up to the last 2 fingers of her hands. This comes and goes. This is primarily with certain positions. She has not noted any one-sided weakness, slurring of speech, or droopy face. She does have a remote history of Bell's palsy she states. A couple of weeks ago she woke up with numbness affecting her right upper limb and lower limb which went away after a few minutes. She  did not have any associated headache, speech issues, facial symptoms, or weakness. Currently, she has mild blurry vision. She has been referred to ophthalmology and gastroenterology by you as well. She has seen an optometrist before as well as an ophthalmologist. She has never seen a neurologist.  Her Past Medical History Is Significant For: Past Medical History  Diagnosis Date  . Stress incontinence   . Depression   . Anxiety     Her Past Surgical History Is Significant For: Past Surgical History  Procedure Laterality Date  . Cesarean section  2004  . Myomectomy  2003    Her Family History Is Significant For: Family History  Problem Relation Age of Onset  . Cancer Father 30    prostate  . Hypertension Mother   . High Cholesterol Mother   . Cancer Maternal Aunt     breast  . Diabetes Maternal Grandmother     Her Social History Is Significant For: History   Social History  . Marital Status: Legally Separated    Spouse Name: N/A    Number of Children: 1  . Years of Education: 76   Occupational History  .  [employer]   Social History Main Topics  . Smoking status: Never Smoker   . Smokeless tobacco: Never Used  . Alcohol Use: Yes     Comment: occas.  . Drug Use: No  . Sexual Activity: Yes   Other Topics Concern  . None   Social History Narrative   Patient consumes 3-4 sodas weekly  Her Allergies Are:  Allergies  Allergen Reactions  . Penicillins Hives  . Other Hives    Uncoded Allergy. Allergen: CLINDOMYCIN  :   Her Current Medications Are:  Outpatient Encounter Prescriptions as of 01/09/2014  Medication Sig  . Fe Cbn-Fe Gluc-FA-B12-C-DSS (FERRALET 90) 90-1 MG TABS Take 1 tablet by mouth daily.  . Omega 3 1000 MG CAPS Take 1,000 mg by mouth daily.  . [DISCONTINUED] Fe Fum-Vit C-Vit B12-FA (TRIGELS-F FORTE) 460-60-0.01-1 MG CAPS Take 1 capsule by mouth daily.  : Review of Systems:  Out of a complete 14 point review of systems, all are  reviewed and negative with the exception of these symptoms as listed below:   Review of Systems  Constitutional: Positive for fatigue.       Weight gain  Eyes:       Blurred vision  Cardiovascular: Positive for leg swelling.  Neurological: Positive for numbness.       Memory loss  Hematological:       Anemia  Psychiatric/Behavioral:       Depression, anxiety, not enough sleep, decreased energy    Objective:  Neurologic Exam  Physical Exam Physical Examination:   Filed Vitals:   01/09/14 1316  BP: 114/70  Pulse: 71  Temp: 99.1 F (37.3 C)    General Examination: The patient is a very pleasant 53 y.o. female in no acute distress. She appears well-developed and well-nourished and very well groomed.   HEENT: Normocephalic, atraumatic, pupils are equal, round and reactive to light and accommodation. Her visual acuity was 2020 on the left and 20/30 on the right with corrective glasses in place.  Visual fields are full by finger perimetry.  Funduscopic exam is normal with sharp disc margins noted. Extraocular tracking is good without limitation to gaze excursion or nystagmus noted. Normal smooth pursuit is noted. Hearing is grossly intact. Tympanic membranes are clear bilaterally. Face is symmetric with normal facial animation and normal facial sensation. Speech is clear with no dysarthria noted. There is no hypophonia. There is no lip, neck/head, jaw or voice tremor. Neck is supple with full range of passive and active motion. There are no carotid bruits on auscultation. Oropharynx exam reveals: mild mouth dryness, good dental hygiene and mild airway crowding. Mallampati is class I. Tongue protrudes centrally and palate elevates symmetrically. Neck size is 13.5 inches. She has a Mild overbite.    Chest: Clear to auscultation without wheezing, rhonchi or crackles noted.  Heart: S1+S2+0, regular and normal without murmurs, rubs or gallops noted.   Abdomen: Soft, non-tender and  non-distended with normal bowel sounds appreciated on auscultation.  Extremities: There is no pitting edema in the distal lower extremities bilaterally. Pedal pulses are intact.  Skin: Warm and dry without trophic changes noted. There are no varicose veins.  Musculoskeletal: exam reveals no obvious joint deformities, tenderness or joint swelling or erythema.   Neurologically:  Mental status: The patient is awake, alert and oriented in all 4 spheres. Her immediate and remote memory, attention, language skills and fund of knowledge are appropriate. There is no evidence of aphasia, agnosia, apraxia or anomia. Speech is clear with normal prosody and enunciation. Thought process is linear. Mood is normal and affect is normal.  Cranial nerves II - XII are as described above under HEENT exam. In addition: shoulder shrug is normal with equal shoulder height noted. Motor exam: Normal bulk, strength and tone is noted. There is no drift, tremor or rebound. Romberg is negative. Reflexes are 2+ throughout.  Babinski: Toes are flexor bilaterally. Fine motor skills and coordination: intact with normal finger taps, normal hand movements, normal rapid alternating patting, normal foot taps and normal foot agility.  Cerebellar testing: No dysmetria or intention tremor on finger to nose testing. Heel to shin is unremarkable bilaterally. There is no truncal or gait ataxia.  Sensory exam: intact to light touch, pinprick, vibration, temperature sense in the upper and lower extremities.  Gait, station and balance: She stands easily. No veering to one side is noted. No leaning to one side is noted. Posture is age-appropriate and stance is narrow based. Gait shows normal stride length and normal pace. No problems turning are noted. She turns en bloc. Tandem walk is unremarkable. Intact toe and heel stance is noted.               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 She has had intermittent numbness in both upper arms which are in keeping with compression of the ulnar nerves at the elbows when putting pressure on the elbows. Her intermittent blurry vision is difficult to explain for me. I'm not sure that we are dealing with a primary neurological disorder at this time. Overall, her physical exam and neurological exam are nonfocal today and I reassured her in that regard.  Since she has never had a brain MRI would like to proceed with a brain MRI with and without contrast. She's encouraged to seek consultation with ophthalmology. We will call her with her MRI results and take it from there. I can probably see her back on an as-needed basis and the patient was in agreement.   Thank you very much for allowing me to participate in the care of this nice patient. If I can be of any further assistance to you please do not hesitate to call me at [phone].  Sincerely,   Star Age, MD, PhD

## 2014-01-10 ENCOUNTER — Ambulatory Visit: Payer: BC Managed Care – PPO | Admitting: Neurology

## 2014-09-02 ENCOUNTER — Other Ambulatory Visit: Payer: Self-pay | Admitting: Cardiology

## 2014-09-02 ENCOUNTER — Ambulatory Visit
Admission: RE | Admit: 2014-09-02 | Discharge: 2014-09-02 | Disposition: A | Discharge status: Home / Self Care | Payer: Self-pay | Source: Ambulatory Visit | Attending: Cardiology | Admitting: Cardiology

## 2014-09-02 DIAGNOSIS — R0602 Shortness of breath: Secondary | ICD-10-CM

## 2014-10-01 DIAGNOSIS — Z8639 Personal history of other endocrine, nutritional and metabolic disease: Secondary | ICD-10-CM | POA: Insufficient documentation

## 2014-10-01 DIAGNOSIS — N951 Menopausal and female climacteric states: Secondary | ICD-10-CM | POA: Insufficient documentation

## 2014-10-03 ENCOUNTER — Other Ambulatory Visit: Payer: Self-pay | Admitting: Obstetrics and Gynecology

## 2014-10-03 DIAGNOSIS — Z1231 Encounter for screening mammogram for malignant neoplasm of breast: Secondary | ICD-10-CM

## 2014-10-09 ENCOUNTER — Ambulatory Visit (HOSPITAL_COMMUNITY)
Admission: RE | Admit: 2014-10-09 | Discharge: 2014-10-09 | Disposition: A | Discharge status: Home / Self Care | Payer: BLUE CROSS/BLUE SHIELD | Source: Ambulatory Visit | Attending: Obstetrics and Gynecology | Admitting: Obstetrics and Gynecology

## 2014-10-09 DIAGNOSIS — Z1231 Encounter for screening mammogram for malignant neoplasm of breast: Secondary | ICD-10-CM

## 2014-12-11 ENCOUNTER — Other Ambulatory Visit (HOSPITAL_COMMUNITY)
Admission: RE | Admit: 2014-12-11 | Discharge: 2014-12-11 | Disposition: A | Discharge status: Home / Self Care | Payer: BLUE CROSS/BLUE SHIELD | Source: Ambulatory Visit | Attending: Obstetrics and Gynecology | Admitting: Obstetrics and Gynecology

## 2014-12-11 ENCOUNTER — Other Ambulatory Visit: Payer: Self-pay | Admitting: Obstetrics and Gynecology

## 2014-12-11 DIAGNOSIS — Z01419 Encounter for gynecological examination (general) (routine) without abnormal findings: Secondary | ICD-10-CM | POA: Insufficient documentation

## 2014-12-11 DIAGNOSIS — Z1151 Encounter for screening for human papillomavirus (HPV): Secondary | ICD-10-CM | POA: Diagnosis not present

## 2014-12-12 LAB — CYTOLOGY - PAP

## 2015-02-25 ENCOUNTER — Ambulatory Visit (INDEPENDENT_AMBULATORY_CARE_PROVIDER_SITE_OTHER): Payer: BLUE CROSS/BLUE SHIELD | Admitting: Sports Medicine

## 2015-02-25 ENCOUNTER — Encounter: Payer: Self-pay | Admitting: Sports Medicine

## 2015-02-25 VITALS — BP 112/78 | Ht 64.0 in | Wt 170.0 lb

## 2015-02-25 DIAGNOSIS — S5002XA Contusion of left elbow, initial encounter: Secondary | ICD-10-CM | POA: Diagnosis not present

## 2015-02-25 DIAGNOSIS — M722 Plantar fascial fibromatosis: Secondary | ICD-10-CM | POA: Diagnosis not present

## 2015-02-25 DIAGNOSIS — M25511 Pain in right shoulder: Secondary | ICD-10-CM | POA: Diagnosis not present
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
## 2015-03-17 ENCOUNTER — Ambulatory Visit: Payer: BLUE CROSS/BLUE SHIELD | Admitting: Sports Medicine

## 2015-07-01 ENCOUNTER — Other Ambulatory Visit: Payer: Self-pay | Admitting: Obstetrics and Gynecology

## 2015-08-30 ENCOUNTER — Ambulatory Visit (HOSPITAL_COMMUNITY)
Admission: EM | Admit: 2015-08-30 | Discharge: 2015-08-30 | Disposition: A | Discharge status: Home / Self Care | Payer: 59 | Attending: Family Medicine | Admitting: Family Medicine

## 2015-08-30 ENCOUNTER — Encounter (HOSPITAL_COMMUNITY): Payer: Self-pay | Admitting: *Deleted

## 2015-08-30 DIAGNOSIS — J029 Acute pharyngitis, unspecified: Secondary | ICD-10-CM | POA: Diagnosis present

## 2015-08-30 DIAGNOSIS — J0100 Acute maxillary sinusitis, unspecified: Secondary | ICD-10-CM | POA: Insufficient documentation

## 2015-08-30 DIAGNOSIS — Z880 Allergy status to penicillin: Secondary | ICD-10-CM | POA: Diagnosis not present

## 2015-08-30 LAB — POCT RAPID STREP A: Streptococcus, Group A Screen (Direct): NEGATIVE

## 2015-08-30 MED ORDER — IPRATROPIUM BROMIDE 0.06 % NA SOLN: 2.0000 | Freq: Four times a day (QID) | NASAL | Status: DC

## 2015-08-30 MED ORDER — DOXYCYCLINE HYCLATE 100 MG PO CAPS: 100.0000 mg | ORAL_CAPSULE | Freq: Two times a day (BID) | ORAL | Status: DC

## 2015-08-30 MED ORDER — GUAIFENESIN-CODEINE 100-10 MG/5ML PO SYRP: 10.0000 mL | ORAL_SOLUTION | Freq: Four times a day (QID) | ORAL | Status: DC | PRN

## 2015-08-30 NOTE — ED Provider Notes (Addendum)
CSN: [id number]     Arrival date & time 08/30/15  1950 History   First MD Initiated Contact with Patient 08/30/15 2018     Chief Complaint  Patient presents with  . Sore Throat   (Consider location/radiation/quality/duration/timing/severity/associated sxs/prior Treatment) Patient is a 55 y.o. female presenting with pharyngitis. The history is provided by the patient.  Sore Throat This is a new problem. The current episode started more than 2 days ago. The problem has been gradually worsening. Associated symptoms include headaches. Pertinent negatives include no chest pain, no abdominal pain and no shortness of breath.    Past Medical History  Diagnosis Date  . Stress incontinence   . Depression   . Anxiety    Past Surgical History  Procedure Laterality Date  . Cesarean section  2004  . Myomectomy  2003   Family History  Problem Relation Age of Onset  . Cancer Father 54    prostate  . Hypertension Mother   . High Cholesterol Mother   . Cancer Maternal Aunt     breast  . Diabetes Maternal Grandmother    Social History  Substance Use Topics  . Smoking status: Never Smoker   . Smokeless tobacco: Never Used  . Alcohol Use: Yes     Comment: occas.   OB History    No data available     Review of Systems  Constitutional: Negative for fever.  HENT: Positive for congestion, ear pain, postnasal drip, rhinorrhea and sore throat.   Respiratory: Negative.  Negative for shortness of breath.   Cardiovascular: Negative.  Negative for chest pain.  Gastrointestinal: Negative.  Negative for abdominal pain.  Neurological: Positive for headaches.  All other systems reviewed and are negative.   Allergies  Penicillins and Other  Home Medications   Prior to Admission medications   Medication Sig Start Date End Date Taking? Authorizing Provider  doxycycline (VIBRAMYCIN) 100 MG capsule Take 1 capsule (100 mg total) by mouth 2 (two) times daily. 08/30/15   Billy Fischer, MD  Fe Cbn-Fe  Gluc-FA-B12-C-DSS (FERRALET 90) 90-1 MG TABS Take 1 tablet by mouth daily.    Historical Provider, MD  ferrous sulfate 220 (44 FE) MG/5ML solution TK 10 ML PO ONCE D WITH 1/5 GLASS OF ORANGE JUICE 30 MINUTES BEFORE BREAKFAST 02/06/15   Historical Provider, MD  guaiFENesin-codeine (ROBITUSSIN AC) 100-10 MG/5ML syrup Take 10 mLs by mouth 4 (four) times daily as needed for cough. 08/30/15   Billy Fischer, MD  ipratropium (ATROVENT) 0.06 % nasal spray Place 2 sprays into both nostrils 4 (four) times daily. 08/30/15   Billy Fischer, MD  ketoconazole (NIZORAL) 2 % cream APP EXT AA QD FOR 14 DAYS 02/06/15   Historical Provider, MD  Omega 3 1000 MG CAPS Take 1,000 mg by mouth daily.    Historical Provider, MD   Meds Ordered and Administered this Visit  Medications - No data to display  There were no vitals taken for this visit. No data found.   Physical Exam  Constitutional: She is oriented to person, place, and time. She appears well-developed and well-nourished.  HENT:  Right Ear: External ear normal.  Left Ear: External ear normal.  Mouth/Throat: Oropharyngeal exudate present.  Eyes: Conjunctivae are normal. Pupils are equal, round, and reactive to light.  Neck: Normal range of motion. Neck supple.  Cardiovascular: Normal rate, regular rhythm, normal heart sounds and intact distal pulses.   Pulmonary/Chest: Effort normal and breath sounds normal.  Lymphadenopathy:  She has cervical adenopathy.  Neurological: She is alert and oriented to person, place, and time.  Skin: Skin is warm and dry.  Nursing note and vitals reviewed.   ED Course  Procedures (including critical care time)  Labs Review Labs Reviewed - No data to display Strep neg.  Imaging Review No results found.   Visual Acuity Review  Right Eye Distance:   Left Eye Distance:   Bilateral Distance:    Right Eye Near:   Left Eye Near:    Bilateral Near:         MDM   1. Subacute maxillary sinusitis    Meds  ordered this encounter  Medications  . doxycycline (VIBRAMYCIN) 100 MG capsule    Sig: Take 1 capsule (100 mg total) by mouth 2 (two) times daily.    Dispense:  20 capsule    Refill:  0  . ipratropium (ATROVENT) 0.06 % nasal spray    Sig: Place 2 sprays into both nostrils 4 (four) times daily.    Dispense:  15 mL    Refill:  12  . guaiFENesin-codeine (ROBITUSSIN AC) 100-10 MG/5ML syrup    Sig: Take 10 mLs by mouth 4 (four) times daily as needed for cough.    Dispense:  180 mL    Refill:  0       Billy Fischer, MD 08/30/15 2035  Billy Fischer, MD 08/30/15 2035

## 2015-08-30 NOTE — ED Notes (Signed)
Pt  Reports   Symptoms  Of   sorethroat  Neck pain      With  Pain  When  She  Swallows          Symptoms  Began   Yesterday    Pt   Reports  Some   Tenderness  In  Her  Neck  Glands   As   Well

## 2015-09-01 ENCOUNTER — Emergency Department (HOSPITAL_COMMUNITY): Payer: 59

## 2015-09-01 ENCOUNTER — Emergency Department (HOSPITAL_COMMUNITY)
Admission: EM | Admit: 2015-09-01 | Discharge: 2015-09-01 | Disposition: A | Discharge status: Home / Self Care | Payer: 59 | Attending: Emergency Medicine | Admitting: Emergency Medicine

## 2015-09-01 ENCOUNTER — Encounter (HOSPITAL_COMMUNITY): Payer: Self-pay

## 2015-09-01 DIAGNOSIS — R51 Headache: Secondary | ICD-10-CM | POA: Diagnosis not present

## 2015-09-01 DIAGNOSIS — R110 Nausea: Secondary | ICD-10-CM | POA: Insufficient documentation

## 2015-09-01 DIAGNOSIS — J029 Acute pharyngitis, unspecified: Secondary | ICD-10-CM | POA: Diagnosis not present

## 2015-09-01 DIAGNOSIS — M6520 Calcific tendinitis, unspecified site: Secondary | ICD-10-CM | POA: Diagnosis not present

## 2015-09-01 DIAGNOSIS — Z880 Allergy status to penicillin: Secondary | ICD-10-CM | POA: Insufficient documentation

## 2015-09-01 DIAGNOSIS — R509 Fever, unspecified: Secondary | ICD-10-CM | POA: Insufficient documentation

## 2015-09-01 DIAGNOSIS — Z8659 Personal history of other mental and behavioral disorders: Secondary | ICD-10-CM | POA: Insufficient documentation

## 2015-09-01 DIAGNOSIS — R5383 Other fatigue: Secondary | ICD-10-CM | POA: Diagnosis not present

## 2015-09-01 DIAGNOSIS — R531 Weakness: Secondary | ICD-10-CM | POA: Insufficient documentation

## 2015-09-01 DIAGNOSIS — R630 Anorexia: Secondary | ICD-10-CM | POA: Diagnosis not present

## 2015-09-01 DIAGNOSIS — M542 Cervicalgia: Secondary | ICD-10-CM | POA: Diagnosis present

## 2015-09-01 DIAGNOSIS — R05 Cough: Secondary | ICD-10-CM | POA: Diagnosis not present

## 2015-09-01 DIAGNOSIS — Z792 Long term (current) use of antibiotics: Secondary | ICD-10-CM | POA: Diagnosis not present

## 2015-09-01 DIAGNOSIS — Z79899 Other long term (current) drug therapy: Secondary | ICD-10-CM | POA: Diagnosis not present

## 2015-09-01 LAB — COMPREHENSIVE METABOLIC PANEL
ALBUMIN: 3.9 g/dL (ref 3.5–5.0)
ALT: 13 U/L — AB (ref 14–54)
AST: 23 U/L (ref 15–41)
Alkaline Phosphatase: 85 U/L (ref 38–126)
Anion gap: 8 (ref 5–15)
BUN: 9 mg/dL (ref 6–20)
CHLORIDE: 104 mmol/L (ref 101–111)
CO2: 26 mmol/L (ref 22–32)
CREATININE: 0.57 mg/dL (ref 0.44–1.00)
Calcium: 9.3 mg/dL (ref 8.9–10.3)
GFR calc Af Amer: >60 mL/min (ref >60)
GFR calc non Af Amer: >60 mL/min (ref >60)
GLUCOSE: 109 mg/dL — AB (ref 65–99)
POTASSIUM: 4 mmol/L (ref 3.5–5.1)
Sodium: 138 mmol/L (ref 135–145)
Total Bilirubin: 0.9 mg/dL (ref 0.3–1.2)
Total Protein: 7.7 g/dL (ref 6.5–8.1)

## 2015-09-01 LAB — CBC WITH DIFFERENTIAL/PLATELET
BASOS ABS: 0 10*3/uL (ref 0.0–0.1)
Basophils Relative: 0 %
EOS PCT: 0 %
Eosinophils Absolute: 0 10*3/uL (ref 0.0–0.7)
HEMATOCRIT: 29.7 % — AB (ref 36.0–46.0)
Hemoglobin: 8.5 g/dL — ABNORMAL LOW (ref 12.0–15.0)
LYMPHS ABS: 1.6 10*3/uL (ref 0.7–4.0)
Lymphocytes Relative: 14 %
MCH: 18.4 pg — ABNORMAL LOW (ref 26.0–34.0)
MCHC: 28.6 g/dL — ABNORMAL LOW (ref 30.0–36.0)
MCV: 64.1 fL — ABNORMAL LOW (ref 78.0–100.0)
MONO ABS: 0.5 10*3/uL (ref 0.1–1.0)
MONOS PCT: 4 %
NEUTROS PCT: 82 %
Neutro Abs: 9.3 10*3/uL — ABNORMAL HIGH (ref 1.7–7.7)
PLATELETS: 260 10*3/uL (ref 150–400)
RBC: 4.63 MIL/uL (ref 3.87–5.11)
RDW: 19 % — AB (ref 11.5–15.5)
WBC: 11.4 10*3/uL — AB (ref 4.0–10.5)

## 2015-09-01 LAB — CSF CELL COUNT WITH DIFFERENTIAL
RBC Count, CSF: 69 /mm3 — ABNORMAL HIGH
TUBE #: 1
WBC, CSF: 1 /mm3 (ref 0–5)

## 2015-09-01 LAB — PROTEIN, CSF: Total  Protein, CSF: 30 mg/dL (ref 15–45)

## 2015-09-01 LAB — URINALYSIS, ROUTINE W REFLEX MICROSCOPIC
Bilirubin Urine: NEGATIVE
GLUCOSE, UA: NEGATIVE mg/dL
HGB URINE DIPSTICK: NEGATIVE
Ketones, ur: NEGATIVE mg/dL
Leukocytes, UA: NEGATIVE
Nitrite: NEGATIVE
Protein, ur: NEGATIVE mg/dL
SPECIFIC GRAVITY, URINE: 1.04 — AB (ref 1.005–1.030)
pH: 7 (ref 5.0–8.0)

## 2015-09-01 LAB — RAPID STREP SCREEN (MED CTR MEBANE ONLY): Streptococcus, Group A Screen (Direct): NEGATIVE

## 2015-09-01 LAB — MONONUCLEOSIS SCREEN: Mono Screen: NEGATIVE

## 2015-09-01 LAB — I-STAT CG4 LACTIC ACID, ED: Lactic Acid, Venous: 0.87 mmol/L (ref 0.5–2.0)

## 2015-09-01 LAB — GLUCOSE, CSF: GLUCOSE CSF: 65 mg/dL (ref 40–70)

## 2015-09-01 MED ORDER — IOPAMIDOL (ISOVUE-300) INJECTION 61%
INTRAVENOUS | Status: AC
  Administered 2015-09-01: 75 mL
  Filled 2015-09-01: qty 75

## 2015-09-01 MED ORDER — SODIUM CHLORIDE 0.9 % IV BOLUS (SEPSIS)
1000.0000 mL | Freq: Once | INTRAVENOUS | Status: AC
  Administered 2015-09-01: 1000 mL via INTRAVENOUS

## 2015-09-01 MED ORDER — GADOBENATE DIMEGLUMINE 529 MG/ML IV SOLN
15.0000 mL | Freq: Once | INTRAVENOUS | Status: AC | PRN
  Administered 2015-09-01: 15 mL via INTRAVENOUS

## 2015-09-01 MED ORDER — NAPROXEN 500 MG PO TABS: 500.0000 mg | ORAL_TABLET | Freq: Two times a day (BID) | ORAL | Status: DC

## 2015-09-01 MED ORDER — LIDOCAINE HCL (PF) 1 % IJ SOLN
INTRAMUSCULAR | Status: AC
  Administered 2015-09-01: 5 mL
  Filled 2015-09-01: qty 5

## 2015-09-01 MED ORDER — ACETAMINOPHEN 325 MG PO TABS: 650.0000 mg | ORAL_TABLET | ORAL | Status: DC | PRN

## 2015-09-01 NOTE — ED Provider Notes (Signed)
Patient CSF shows 1 white blood cell but this is in the setting of 69 RBCs. Not consistent with meningitis. Gram stain negative. MR of neck shows acute calcific prevertebral tendinitis. No signs of deep abscess. Patient overall appears well and has remained stable. There appears to be no emergent findings or signs of bacterial infection. Patient is stable for discharge home with strict return precautions. Follow-up closely with PCP as an outpatient.  Results for orders placed or performed during the hospital encounter of 09/01/15  Rapid strep screen  Result Value Ref Range   Streptococcus, Group A Screen (Direct) NEGATIVE NEGATIVE  CSF culture  Result Value Ref Range   Specimen Description CSF    Special Requests NONE    Gram Stain      WBC PRESENT, PREDOMINANTLY MONONUCLEAR NO ORGANISMS SEEN CYTOSPIN    Culture PENDING    Report Status PENDING   CBC with Differential  Result Value Ref Range   WBC 11.4 (H) 4.0 - 10.5 K/uL   RBC 4.63 3.87 - 5.11 MIL/uL   Hemoglobin 8.5 (L) 12.0 - 15.0 g/dL   HCT 29.7 (L) 36.0 - 46.0 %   MCV 64.1 (L) 78.0 - 100.0 fL   MCH 18.4 (L) 26.0 - 34.0 pg   MCHC 28.6 (L) 30.0 - 36.0 g/dL   RDW 19.0 (H) 11.5 - 15.5 %   Platelets 260 150 - 400 K/uL   Neutrophils Relative % 82 %   Lymphocytes Relative 14 %   Monocytes Relative 4 %   Eosinophils Relative 0 %   Basophils Relative 0 %   Neutro Abs 9.3 (H) 1.7 - 7.7 K/uL   Lymphs Abs 1.6 0.7 - 4.0 K/uL   Monocytes Absolute 0.5 0.1 - 1.0 K/uL   Eosinophils Absolute 0.0 0.0 - 0.7 K/uL   Basophils Absolute 0.0 0.0 - 0.1 K/uL   RBC Morphology POLYCHROMASIA PRESENT   Comprehensive metabolic panel  Result Value Ref Range   Sodium 138 135 - 145 mmol/L   Potassium 4.0 3.5 - 5.1 mmol/L   Chloride 104 101 - 111 mmol/L   CO2 26 22 - 32 mmol/L   Glucose, Bld 109 (H) 65 - 99 mg/dL   BUN 9 6 - 20 mg/dL   Creatinine, Ser 0.57 0.44 - 1.00 mg/dL   Calcium 9.3 8.9 - 10.3 mg/dL   Total Protein 7.7 6.5 - 8.1 g/dL    Albumin 3.9 3.5 - 5.0 g/dL   AST 23 15 - 41 U/L   ALT 13 (L) 14 - 54 U/L   Alkaline Phosphatase 85 38 - 126 U/L   Total Bilirubin 0.9 0.3 - 1.2 mg/dL   GFR calc non Af Amer >60 >60 mL/min   GFR calc Af Amer >60 >60 mL/min   Anion gap 8 5 - 15  Mononucleosis screen  Result Value Ref Range   Mono Screen NEGATIVE NEGATIVE  Urinalysis, Routine w reflex microscopic (not at [facility])  Result Value Ref Range   Color, Urine YELLOW YELLOW   APPearance CLEAR CLEAR   Specific Gravity, Urine 1.040 (H) 1.005 - 1.030   pH 7.0 5.0 - 8.0   Glucose, UA NEGATIVE NEGATIVE mg/dL   Hgb urine dipstick NEGATIVE NEGATIVE   Bilirubin Urine NEGATIVE NEGATIVE   Ketones, ur NEGATIVE NEGATIVE mg/dL   Protein, ur NEGATIVE NEGATIVE mg/dL   Nitrite NEGATIVE NEGATIVE   Leukocytes, UA NEGATIVE NEGATIVE  Glucose, CSF  Result Value Ref Range   Glucose, CSF 65 40 - 70 mg/dL  Protein, CSF  Result Value Ref Range   Total  Protein, CSF 30 15 - 45 mg/dL  CSF cell count with differential  Result Value Ref Range   Tube # 1    Color, CSF COLORLESS COLORLESS   Appearance, CSF CLEAR CLEAR   Supernatant NOT INDICATED    RBC Count, CSF 69 (H) 0 /cu mm   WBC, CSF 1 0 - 5 /cu mm   Other Cells, CSF TOO FEW TO COUNT, SMEAR AVAILABLE FOR REVIEW   I-Stat CG4 Lactic Acid, ED  Result Value Ref Range   Lactic Acid, Venous 0.87 0.5 - 2.0 mmol/L   Dg Chest 2 View  09/01/2015  CLINICAL DATA:  Fever. Productive cough for 1 week. Sore throat and earache. Lethargy. EXAM: CHEST  2 VIEW COMPARISON:  09/02/2014 FINDINGS: The heart size and mediastinal contours are within normal limits. Both lungs are clear. No evidence of pneumothorax or pleural effusion. Moderate thoracic dextroscoliosis remains stable. IMPRESSION: No active cardiopulmonary disease.  Stable thoracic scoliosis. Electronically Signed   By: Earle Gell M.D.   On: 09/01/2015 14:48   Ct Head Wo Contrast  09/01/2015  CLINICAL DATA:  55 year old female with left side neck pain  radiating to the left head for 3 days. Intermittent fever. Limited range of motion. Initial encounter. EXAM: CT HEAD WITHOUT CONTRAST TECHNIQUE: Contiguous axial images were obtained from the base of the skull through the vertex without intravenous contrast. COMPARISON:  Neck CT with contrast from today reported separately. FINDINGS: Mild left mastoid effusion. Right mastoids in visible paranasal sinuses are clear. No osseous abnormality identified. Visualized orbits and scalp soft tissues are within normal limits. Cerebral volume is within normal limits. No midline shift, ventriculomegaly, mass effect, evidence of mass lesion, intracranial hemorrhage or evidence of cortically based acute infarction. Gray-white matter differentiation is within normal limits throughout the brain. No suspicious intracranial vascular hyperdensity. IMPRESSION: 1.  Normal noncontrast CT appearance of the brain. 2. Mild left mastoid effusion without other CT findings of acute left middle ear infection. This might be postinflammatory. Electronically Signed   By: Genevie Ann M.D.   On: 09/01/2015 16:11   Ct Soft Tissue Neck W Contrast  09/01/2015  CLINICAL DATA:  55 year old female with left side neck pain radiating to the left head for 3 days. Intermittent fever. Limited range of motion. Initial encounter. EXAM: CT NECK WITH CONTRAST TECHNIQUE: Multidetector CT imaging of the neck was performed using the standard protocol following the bolus administration of intravenous contrast. CONTRAST:  67mL ISOVUE-300 IOPAMIDOL (ISOVUE-300) INJECTION 61% COMPARISON:  None. FINDINGS: Pharynx and larynx: Larynx is within normal limits, the glottis is closed. Pharyngeal soft tissue contours are within normal limits. The parapharyngeal spaces are normal. There is abnormal retropharyngeal hypodensity compatible with a mild effusion (series 3, image 41). No tonsillar or adenoid hyper enhancement identified. Salivary glands: Negative sublingual space.  Submandibular glands and parotid glands are within normal limits. Thyroid: Negative. Lymph nodes: Bilateral cervical lymph nodes are within normal limits. The largest nodes are at the bilateral level 2 stations measuring 8 mm short axis bilaterally. No lymph nodes appear asymmetrically enlarged or increased in number. No cystic or necrotic nodes. Vascular: Major vascular structures in the neck and at the skullbase are patent. Dominant right vertebral artery. Limited intracranial: Negative. Visualized orbits: Negative. Mastoids and visualized paranasal sinuses: Mild left mastoid effusion with occasional left mastoid fluid levels (series 3, image 9). The left tympanic cavity appears to remain clear. The right mastoids are  clear. The paranasal sinuses are well pneumatized with minimal maxillary sinus mucosal thickening. Skeleton: No acute dental finding. Intermittent chronic disc and endplate degeneration in the cervical spine. Mild scoliosis in the cervical and visualized upper thoracic spine. No acute osseous abnormality identified. Upper chest: No superior mediastinal lymphadenopathy. Negative visualized axillary lymph nodes. Negative lung apices. IMPRESSION: 1. Abnormal but nonspecific retropharyngeal effusion. These can be seen in the setting of upper respiratory infection. No CT evidence of tonsillitis. No neck abscess or lymphadenopathy. 2. If the patient has severe neck pain, then consider follow-up cervical spine MRI to exclude cervical spine infection as a source for the retropharyngeal fluid in #1. 3. Otherwise negative neck CT. Electronically Signed   By: Genevie Ann M.D.   On: 09/01/2015 16:08   Mr Cervical Spine W Wo Contrast  09/01/2015  CLINICAL DATA:  55 year old female with left side neck pain radiating to the head for 3 days with intermittent fever. Retropharyngeal effusion on CT. EXAM: MRI CERVICAL SPINE WITHOUT AND WITH CONTRAST TECHNIQUE: Multiplanar and multiecho pulse sequences of the cervical  spine, to include the craniocervical junction and cervicothoracic junction, were obtained according to standard protocol without and with intravenous contrast. CONTRAST:  38mL MULTIHANCE GADOBENATE DIMEGLUMINE 529 MG/ML IV SOLN COMPARISON:  CT neck from 1542 hours today. FINDINGS: Small retropharyngeal effusion re- demonstrated extending from the C2 level to the C5 level. Mild associated hyper enhancement. Notably, on today's neck CT there is dystrophic calcification of the left longus coli muscle just inferior to the C1 attachment. Best seen on sagittal STIR images there is mild abnormal increased STIR activity in the superior aspect of the left longus coli muscles (series 5 images 14 through 16) extending to the left C3 level. There also appears to be mild hyper enhancement adjacent to the dystrophic calcification seen on the earlier CT. The other paraspinal soft tissues are normal. There is mild endplate marrow edema at C3-C4, but this appears to be degenerative in nature and there is no intervening abnormal increased signal or enhancement of the C3-C4 disc. No cervical dural thickening or abnormal intradural enhancement. Normal bone marrow signal elsewhere. Cervicomedullary junction is within normal limits. Spinal cord signal is within normal limits at all visualized levels. Negative visualized brain parenchyma. Chronic cervical disc degeneration at C3-C4, C5-C6, and C6-C7 resulting in up to mild spinal stenosis with no spinal cord mass effect. IMPRESSION: 1. 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 2. No evidence of cervical spinal infection. 3. Intermittent cervical spine disc degeneration including at C3-C4 where are associated degenerative marrow signal changes and mild degenerative spinal stenosis. Electronically Signed   By: Genevie Ann M.D.   On: 09/01/2015 21:39   Dg Lumbar Puncture  Fluoro Guide  09/01/2015  CLINICAL DATA:  Severe neck pain and headache. EXAM: DIAGNOSTIC LUMBAR PUNCTURE UNDER FLUOROSCOPIC GUIDANCE FLUOROSCOPY TIME:  Fluoroscopy Time (in minutes and seconds): 0 minutes 20 seconds Number of Acquired Images:  1 PROCEDURE: Informed consent was obtained from the patient prior to the procedure, including potential complications of headache, allergy, and pain. With the patient prone, the lower back was prepped with Betadine. 1% Lidocaine was used for local anesthesia. Lumbar puncture was performed at the L2-3 level using a 20 gauge needle with return of clear CSF with an opening pressure of 19 cm water. Fifteen ml of CSF were obtained for laboratory studies. The patient tolerated the procedure well and  there were no apparent complications. IMPRESSION: Successful diagnostic lumbar puncture under fluoroscopic guidance at level of L2-3. No evidence of immediate complication. Electronically Signed   By: Earle Gell M.D.   On: 09/01/2015 18:52      Sherwood Gambler, MD 09/02/15 [phone]

## 2015-09-01 NOTE — ED Notes (Signed)
MD at bedside. 

## 2015-09-01 NOTE — ED Provider Notes (Signed)
CSN: [id number]     Arrival date & time 09/01/15  1246 History   First MD Initiated Contact with Patient 09/01/15 1357     Chief Complaint  Patient presents with  . Neck Pain     (Consider location/radiation/quality/duration/timing/severity/associated sxs/prior Treatment) HPI Comments: Patient sent from PCPs office with concern for meningitis. States she's had a cough for one week that has been productive of clear mucus. 3 days ago she developed pain in her head and her neck and pain with range of motion of her neck. She also had a sore throat at that time which has since resolved. Has had chills but her maximum temperature is 99. Complaints of pain with moving her head to the left and right. She was seen at urgent care 2 days ago and put on doxycycline for sinusitis. She denies any chest pain or shortness of breath. She denies any abdominal pain, nausea. She did have one episode of vomiting after taking the antibiotic on an empty stomach. No focal weakness, numbness or tingling. No photophobia or phonophobia. Denies any sick contacts. Denies any out of the country travel. Denies any camping trips or tick bites. No rashes.  The history is provided by the patient.    Past Medical History  Diagnosis Date  . Stress incontinence   . Depression   . Anxiety    Past Surgical History  Procedure Laterality Date  . Cesarean section  2004  . Myomectomy  2003   Family History  Problem Relation Age of Onset  . Cancer Father 65    prostate  . Hypertension Mother   . High Cholesterol Mother   . Cancer Maternal Aunt     breast  . Diabetes Maternal Grandmother    Social History  Substance Use Topics  . Smoking status: Never Smoker   . Smokeless tobacco: Never Used  . Alcohol Use: Yes     Comment: occas.   OB History    No data available     Review of Systems  Constitutional: Positive for fever, activity change, appetite change and fatigue.  HENT: Positive for sore throat.   Eyes:  Negative for photophobia.  Respiratory: Positive for cough. Negative for chest tightness.   Cardiovascular: Negative for chest pain.  Gastrointestinal: Positive for nausea. Negative for abdominal pain.  Genitourinary: Negative for dysuria and hematuria.  Musculoskeletal: Positive for myalgias, arthralgias and neck pain. Negative for back pain.  Skin: Negative for rash.  Neurological: Positive for weakness and headaches. Negative for dizziness.  A complete 10 system review of systems was obtained and all systems are negative except as noted in the HPI and PMH.      Allergies  Penicillins and Other  Home Medications   Prior to Admission medications   Medication Sig Start Date End Date Taking? Authorizing Provider  doxycycline (VIBRAMYCIN) 100 MG capsule Take 1 capsule (100 mg total) by mouth 2 (two) times daily. 08/30/15   Billy Fischer, MD  Fe Cbn-Fe Gluc-FA-B12-C-DSS (FERRALET 90) 90-1 MG TABS Take 1 tablet by mouth daily.    Historical Provider, MD  ferrous sulfate 220 (44 FE) MG/5ML solution TK 10 ML PO ONCE D WITH 1/5 GLASS OF ORANGE JUICE 30 MINUTES BEFORE BREAKFAST 02/06/15   Historical Provider, MD  guaiFENesin-codeine (ROBITUSSIN AC) 100-10 MG/5ML syrup Take 10 mLs by mouth 4 (four) times daily as needed for cough. 08/30/15   Billy Fischer, MD  ipratropium (ATROVENT) 0.06 % nasal spray Place 2 sprays into both nostrils  4 (four) times daily. 08/30/15   Billy Fischer, MD  ketoconazole (NIZORAL) 2 % cream APP EXT AA QD FOR 14 DAYS 02/06/15   Historical Provider, MD  Omega 3 1000 MG CAPS Take 1,000 mg by mouth daily.    Historical Provider, MD   BP 121/79 mmHg  Pulse 83  Temp(Src) 98.7 F (37.1 C) (Oral)  Resp 17  SpO2 100%  LMP 08/22/2015 Physical Exam  Constitutional: She is oriented to person, place, and time. She appears well-developed and well-nourished. No distress.  Nontoxic, no distress  HENT:  Head: Normocephalic and atraumatic.  Mouth/Throat: Oropharynx is clear and  moist. No oropharyngeal exudate.  Oropharynx appears clear, no asymmetry, uvula midline  Eyes: Conjunctivae and EOM are normal. Pupils are equal, round, and reactive to light.  Neck: Normal range of motion.  Pain with range of motion of neck side to side as well as with flexion and extension without definite meningismus  Cardiovascular: Normal rate, regular rhythm, normal heart sounds and intact distal pulses.   No murmur heard. Pulmonary/Chest: Effort normal and breath sounds normal. No respiratory distress. She exhibits no tenderness.  Abdominal: Soft. There is no tenderness. There is no rebound and no guarding.  Musculoskeletal: Normal range of motion. She exhibits no edema or tenderness.  Neurological: She is alert and oriented to person, place, and time. No cranial nerve deficit. She exhibits normal muscle tone. Coordination normal.  No ataxia on finger to nose bilaterally. No pronator drift. 5/5 strength throughout. CN 2-12 intact.Equal grip strength. Sensation intact.   Skin: Skin is warm.  Psychiatric: She has a normal mood and affect. Her behavior is normal.  Nursing note and vitals reviewed.   ED Course  .Lumbar Puncture Date/Time: 09/01/2015 4:43 PM Performed by: Ezequiel Essex Authorized by: Ezequiel Essex Consent: Verbal consent obtained. Written consent obtained. Risks and benefits: risks, benefits and alternatives were discussed Consent given by: patient Patient understanding: patient states understanding of the procedure being performed Patient consent: the patient's understanding of the procedure matches consent given Procedure consent: procedure consent matches procedure scheduled Relevant documents: relevant documents present and verified Test results: test results available and properly labeled Site marked: the operative site was marked Imaging studies: imaging studies available Patient identity confirmed: verbally with patient and provided demographic data Time  out: Immediately prior to procedure a "time out" was called to verify the correct patient, procedure, equipment, support staff and site/side marked as required. Indications: evaluation for infection Anesthesia: local infiltration Local anesthetic: lidocaine 1% without epinephrine Anesthetic total: 4 ml Patient sedated: no Preparation: Patient was prepped and draped in the usual sterile fashion. Lumbar space: L4-L5 interspace Patient's position: sitting Needle gauge: 20 Needle type: spinal needle - Quincke tip Needle length: 2.5 in Number of attempts: 3 Post-procedure: site cleaned and adhesive bandage applied Patient tolerance: Patient tolerated the procedure well with no immediate complications Comments: Unsuccessful, no CSF obtained.   (including critical care time) Labs Review Labs Reviewed  CBC WITH DIFFERENTIAL/PLATELET - Abnormal; Notable for the following:    WBC 11.4 (*)    Hemoglobin 8.5 (*)    HCT 29.7 (*)    MCV 64.1 (*)    MCH 18.4 (*)    MCHC 28.6 (*)    RDW 19.0 (*)    All other components within normal limits  COMPREHENSIVE METABOLIC PANEL - Abnormal; Notable for the following:    Glucose, Bld 109 (*)    ALT 13 (*)    All other components within  normal limits  CULTURE, BLOOD (ROUTINE X 2)  CULTURE, BLOOD (ROUTINE X 2)  RAPID STREP SCREEN (NOT AT [facility])  CSF CULTURE  MONONUCLEOSIS SCREEN  URINALYSIS, ROUTINE W REFLEX MICROSCOPIC (NOT AT [facility])  CSF CELL COUNT WITH DIFFERENTIAL  CSF CELL COUNT WITH DIFFERENTIAL  PROTEIN AND GLUCOSE, CSF  ROCKY MTN SPOTTED FVR ABS PNL(IGG+IGM)  B. BURGDORFI ANTIBODIES  I-STAT CG4 LACTIC ACID, ED    Imaging Review No results found. I have personally reviewed and evaluated these images and lab results as part of my medical decision-making.   EKG Interpretation None      MDM   Final diagnoses:  Neck pain   1 week of cough, fever and sore throat. Started on doxycycline for sinusitis. Now with neck stiffness and  headache and subjective fever. Sent from PCPs to rule out meningitis.  Patient is afebrile and nontoxic appearing  Well appearing. NO fever.  Leukocytosis noted. Anemia noted. Lactate normal. Cultures sent. Tick bourne panel sent. CT head negative. CT neck with nonspecific RP fluid collection. LP attempted and unsuccessful.  Radiology to perform LP under fluoro.  Will also obtain MRI C spine to further evaluate RP fluid collection. Dr. Regenia Skeeter to assume care at shift change.  Ezequiel Essex, MD 09/01/15 [phone]

## 2015-09-01 NOTE — ED Notes (Addendum)
Pt. Was seen by her [facility] and was sent to [place] to R/O meningitis.  Pt. Has been on Doxycycline since Sunday night for sinususitis.  Pt. Continues to have neck pain primarily lt shoulder into her lt. [place]  Pt. Cannot turn her neck left or right . She can bend it forward. Mask applied to pt. In Triage. Pt. Will stay in Triage 4 until moved to  A Room. Pt. Is alert and oriented X4  Pt. Is afebrile at present time but she has had a fever intermittently.

## 2015-09-01 NOTE — ED Notes (Signed)
Patient transported to CT 

## 2015-09-02 LAB — CULTURE, GROUP A STREP (THRC)

## 2015-09-02 LAB — I-STAT CG4 LACTIC ACID, ED: Lactic Acid, Venous: 0.98 mmol/L (ref 0.5–2.0)

## 2015-09-02 LAB — B. BURGDORFI ANTIBODIES

## 2015-09-03 LAB — ROCKY MTN SPOTTED FVR ABS PNL(IGG+IGM)
RMSF IgG: NEGATIVE
RMSF IgM: 0.38 index (ref 0.00–0.89)

## 2015-09-04 LAB — CULTURE, GROUP A STREP (THRC)

## 2015-09-05 LAB — CSF CULTURE

## 2015-09-05 LAB — CSF CULTURE W GRAM STAIN: Culture: NO GROWTH

## 2015-09-06 LAB — CULTURE, BLOOD (ROUTINE X 2)
Culture: NO GROWTH
Culture: NO GROWTH

## 2015-09-14 ENCOUNTER — Telehealth (HOSPITAL_COMMUNITY): Payer: Self-pay | Admitting: Emergency Medicine

## 2015-09-14 NOTE — ED Notes (Signed)
Called pt and notified of recent lab results from visit 6/4 Pt ID'd properly... Reports feeling better and sx have subsided; also reports she went to the ER on 6/6 and was told to hold off antibiotic given at [facility] Pt reports she has restarted antibiotic  Per Dr. Valere Dross,  Notes Recorded by Sherlene Shams, MD on 09/03/2015 at 5:11 PM Please let patient know that throat cx from UC visit 6/4 was positive for a few non-group A strep; no change in treatment required.  Finish rx doxycycline given at visit. Recheck or followup pcp/Walter Pharr for further evaluation if symptoms persist. LM  Adv pt if sx are not getting better to return  Pt verb understanding

## 2015-12-24 ENCOUNTER — Telehealth: Payer: Self-pay | Admitting: Oncology

## 2015-12-24 ENCOUNTER — Encounter: Payer: Self-pay | Admitting: Oncology

## 2015-12-24 NOTE — Telephone Encounter (Signed)
Appt scheduled with [facility] 10/25 at 11am. Demographics verified. Patient aware to arrive 30 minutes early. Letter to the referring.

## 2016-01-20 ENCOUNTER — Ambulatory Visit (HOSPITAL_BASED_OUTPATIENT_CLINIC_OR_DEPARTMENT_OTHER): Payer: 59 | Admitting: Oncology

## 2016-01-20 ENCOUNTER — Telehealth: Payer: Self-pay | Admitting: Oncology

## 2016-01-20 DIAGNOSIS — D509 Iron deficiency anemia, unspecified: Secondary | ICD-10-CM

## 2016-01-20 DIAGNOSIS — N920 Excessive and frequent menstruation with regular cycle: Secondary | ICD-10-CM

## 2016-01-20 DIAGNOSIS — D500 Iron deficiency anemia secondary to blood loss (chronic): Secondary | ICD-10-CM | POA: Diagnosis not present

## 2016-01-20 DIAGNOSIS — D259 Leiomyoma of uterus, unspecified: Secondary | ICD-10-CM | POA: Diagnosis not present

## 2016-01-20 NOTE — Progress Notes (Signed)
Hematology and Oncology Follow Up Visit  Lindsey Cardenas [id number] [birth year] 55 y.o. 01/20/2016 11:52 AM Lindsey Cardenas, MDPharr, Lindsey Jew, MD   Principle Diagnosis: 55 year old woman with iron deficiency anemia diagnosed in 2007. This is related to menorrhagia secondary to uterine fibroids.   Prior Therapy: Intermittent IV iron therapy most recent of which was in 2014 under the care of Dr. Jamse Cardenas  Current therapy: Oral iron supplements.  Interim History: Lindsey Cardenas presents today for a follow-up visit. She is a pleasant woman had been seen in the past for iron deficiency anemia. She was treated with Feraheme in 2014 but have not followed up with hematology since that time. She continues to have issues with menorrhagia related to uterine fibroids. She developed recently palpitation and was evaluated by her primary care physician. Repeat a CBC at that time showed a hemoglobin of 7.4, MCV of 63 and an RDW of 21. She had a normal white cell count and platelet count.  For the last few years she has been taking in oral iron intermittently although not regularly. She denied any complications related to oral iron although she had difficulties with absorption in the past. She denied any nausea, vomiting. She denied any hematochezia, melena. She denied any hemoptysis.  She does not report any headaches, blurry vision, syncope or seizures. She does not report any fevers, chills or sweats. He does not report any cough, wheezing or hemoptysis. She does not report any nausea, vomiting or abdominal pain. She does not report any frequency urgency or hesitancy. She continues to have menorrhagia with heavy menstrual cycles. She does not report any skeletal complaints. Remaining review of systems unremarkable.  Medications: I have reviewed the patient's current medications.  Current Outpatient Prescriptions  Medication Sig Dispense Refill  . Fe Cbn-Fe Gluc-FA-B12-C-DSS (FERRALET 90) 90-1 MG TABS Take 1  tablet by mouth daily.     No current facility-administered medications for this visit.      Allergies:  Allergies  Allergen Reactions  . Penicillins Hives    Has patient had a PCN reaction causing immediate rash, facial/tongue/throat swelling, SOB or lightheadedness with hypotension:  Unknown Has patient had a PCN reaction causing severe rash involving mucus membranes or skin necrosis: No Has patient had a PCN reaction that required hospitalization No Has patient had a PCN reaction occurring within the last 10 years: No If all of the above answers are "NO", then may proceed with Cephalosporin use.   . Clindamycin/Lincomycin Hives    Past Medical History, Surgical history, Social history, and Family History were reviewed and updated.   Physical Exam: Blood pressure 138/60, pulse 73, temperature 98.3 F (36.8 C), temperature source Oral, resp. rate 18, height 5\' 4"  (1.626 m), weight 172 lb 4.8 oz (78.2 kg), SpO2 100 %. ECOG: 0 General appearance: alert and cooperative Head: Normocephalic, without obvious abnormality Neck: no adenopathy Lymph nodes: Cervical, supraclavicular, and axillary nodes normal. Heart:regular rate and rhythm, S1, S2 normal, no murmur, click, rub or gallop Lung:chest clear, no wheezing, rales, normal symmetric air entry, Heart exam - S1, S2 normal, no murmur, no gallop, rate regular Abdomin: soft, non-tender, without masses or organomegaly EXT:no erythema, induration, or nodules   Lab Results: Lab Results  Component Value Date   WBC 11.4 (H) 09/01/2015   HGB 8.5 (L) 09/01/2015   HCT 29.7 (L) 09/01/2015   MCV 64.1 (L) 09/01/2015   PLT 260 09/01/2015     Chemistry      Component Value Date/Time   NA 138  09/01/2015 1339   NA 140 02/10/2012 1620   K 4.0 09/01/2015 1339   K 3.6 02/10/2012 1620   CL 104 09/01/2015 1339   CL 109 (H) 02/10/2012 1620   CO2 26 09/01/2015 1339   CO2 23 02/10/2012 1620   BUN 9 09/01/2015 1339   BUN 16.0 02/10/2012  1620   CREATININE 0.57 09/01/2015 1339   CREATININE 0.7 02/10/2012 1620      Component Value Date/Time   CALCIUM 9.3 09/01/2015 1339   CALCIUM 9.2 02/10/2012 1620   ALKPHOS 85 09/01/2015 1339   AST 23 09/01/2015 1339   ALT 13 (L) 09/01/2015 1339   BILITOT 0.9 09/01/2015 1339       Impression and Plan:   55 year old woman with the following issues:  1. Iron deficiency anemia related to menstrual losses. She has received intravenous iron in the past on multiple occasions. Last treatment was on 2013. She is currently on oral iron replacement although it appears to be less effective.  Options of treatment were reviewed today including repeat Feraheme infusion versus continuing oral iron therapy. Risks and benefits of those approaches were discussed and she is agreeable to proceed with Feraheme. Complications associated with this treatment including arthralgias, myalgias, infusion related complications and rarely anaphylaxis.  I plan to repeat her iron studies in 3 months to ensure adequacy and repeat treatment as needed.  2. Menorrhagia: This is related to uterine fibroids. She continues to follow up with gynecology regarding this issue and she understands that she will continue to have issues with iron deficiency anemia as long as she has this problem.  3. Follow-up: We'll be in 3 months to repeat iron studies after IV iron infusion.   [id number], MD 10/25/201711:52 AM

## 2016-01-20 NOTE — Telephone Encounter (Signed)
Gave patient avs report and appointments for November and January.  °

## 2016-01-29 ENCOUNTER — Ambulatory Visit (HOSPITAL_BASED_OUTPATIENT_CLINIC_OR_DEPARTMENT_OTHER): Payer: 59

## 2016-01-29 VITALS — BP 110/68 | HR 62 | Temp 98.4°F | Resp 18

## 2016-01-29 DIAGNOSIS — D500 Iron deficiency anemia secondary to blood loss (chronic): Secondary | ICD-10-CM

## 2016-01-29 DIAGNOSIS — D259 Leiomyoma of uterus, unspecified: Secondary | ICD-10-CM | POA: Diagnosis not present

## 2016-01-29 DIAGNOSIS — N920 Excessive and frequent menstruation with regular cycle: Secondary | ICD-10-CM

## 2016-01-29 DIAGNOSIS — D509 Iron deficiency anemia, unspecified: Secondary | ICD-10-CM

## 2016-01-29 MED ORDER — SODIUM CHLORIDE 0.9 % IV SOLN
510.0000 mg | Freq: Once | INTRAVENOUS | Status: AC
  Administered 2016-01-29: 510 mg via INTRAVENOUS
  Filled 2016-01-29: qty 17

## 2016-01-29 MED ORDER — SODIUM CHLORIDE 0.9 % IV SOLN
Freq: Once | INTRAVENOUS | Status: AC
  Administered 2016-01-29: 09:00:00 via INTRAVENOUS

## 2016-01-29 NOTE — Patient Instructions (Signed)

## 2016-02-05 ENCOUNTER — Ambulatory Visit: Payer: 59

## 2016-03-17 ENCOUNTER — Other Ambulatory Visit: Payer: Self-pay | Admitting: Obstetrics and Gynecology

## 2016-03-17 DIAGNOSIS — Z1231 Encounter for screening mammogram for malignant neoplasm of breast: Secondary | ICD-10-CM

## 2016-04-06 ENCOUNTER — Ambulatory Visit: Payer: 59

## 2016-04-18 ENCOUNTER — Encounter: Payer: Self-pay | Admitting: Oncology

## 2016-04-18 ENCOUNTER — Telehealth: Payer: Self-pay | Admitting: Oncology

## 2016-04-18 NOTE — Progress Notes (Signed)
Patient called and left voicemail regarding payment arrangements for bill. Called patient and advised payment arrangements are done through contacting the billing number listed on the bill.

## 2016-04-18 NOTE — Telephone Encounter (Signed)
Per patient request, all upcoming appointments was cancelled. Patient does not wish to reschedule. Patient stated that she was unable to pay for Iron infusions and that the infusions was not a necessity to her. Patient was advised that her upcoming appointments was for Labs and follow up with Dr Alen Blew. Patient was also referred and transferred to [facility] in [place].  Desk nurse advised of appointments cancelled. 04/18/16

## 2016-04-20 ENCOUNTER — Ambulatory Visit: Payer: 59 | Admitting: Oncology

## 2016-04-20 ENCOUNTER — Other Ambulatory Visit: Payer: 59

## 2017-01-18 ENCOUNTER — Other Ambulatory Visit: Payer: Self-pay | Admitting: Obstetrics and Gynecology

## 2017-07-01 IMAGING — MR MR CERVICAL SPINE WO/W CM
4 of 8 series · 18 of 48 positions shown · IV contrast (multihance)
Comparison: CT neck from 7111 hours today.

CLINICAL DATA: 54-year-old female with left side neck pain
radiating to the head for 3 days with intermittent fever.
Retropharyngeal effusion on CT.

EXAM:
MRI CERVICAL SPINE WITHOUT AND WITH CONTRAST
TECHNIQUE: Multiplanar and multiecho pulse sequences of the cervical spine, to
include the craniocervical junction and cervicothoracic junction,
were obtained according to standard protocol without and with
intravenous contrast.
CONTRAST:  15mL MULTIHANCE GADOBENATE DIMEGLUMINE 529 MG/ML IV SOLN

[Series 3: T2 · sagittal · 3.0mm · 0.43mm/px · 3 of 16 slices shown (1 of 2)]
[im 1/16]
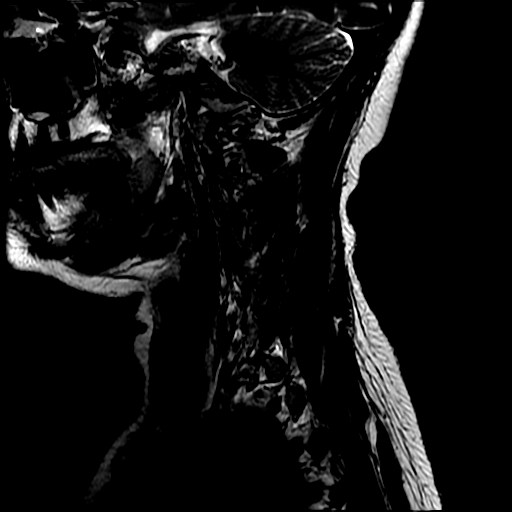
[im 8/16]
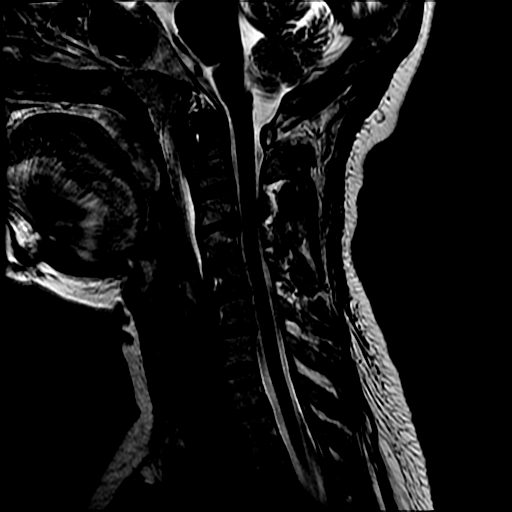
[im 16/16]
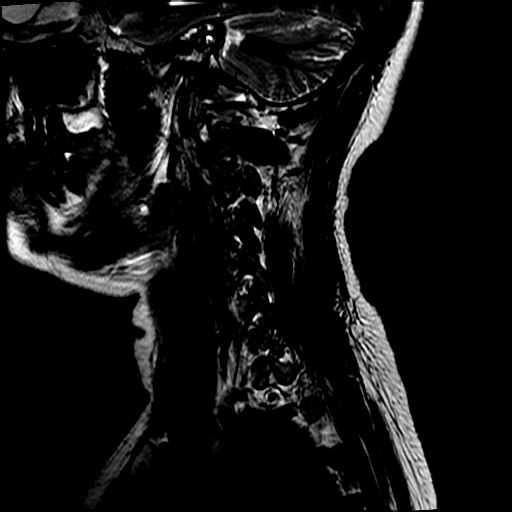

[Series 7: T2 · axial · 3.0mm · 0.35mm/px · z∈[-9,+93]mm · 8 of 33 slices shown (2 of 2)]
[im 1/33]
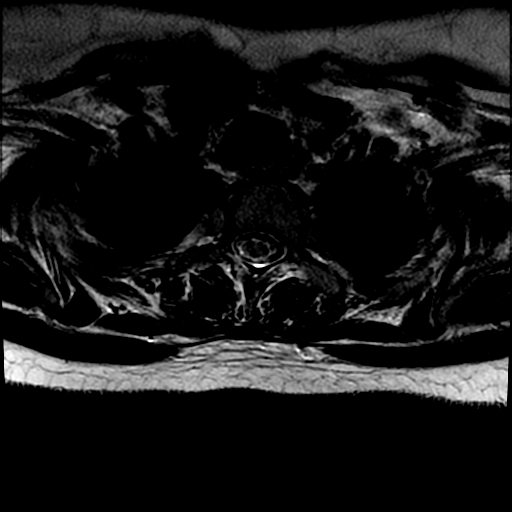
[im 5/33]
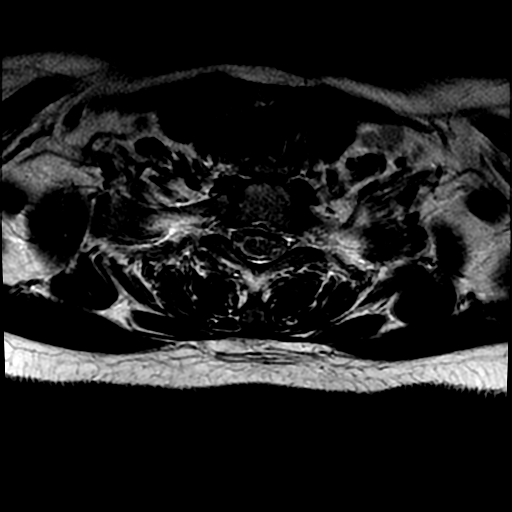
[im 10/33]
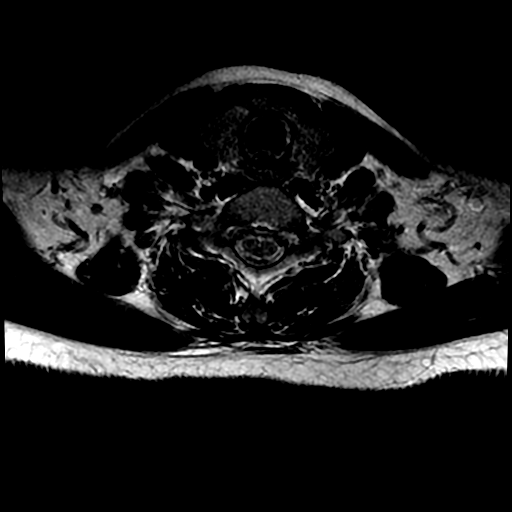
[im 14/33]
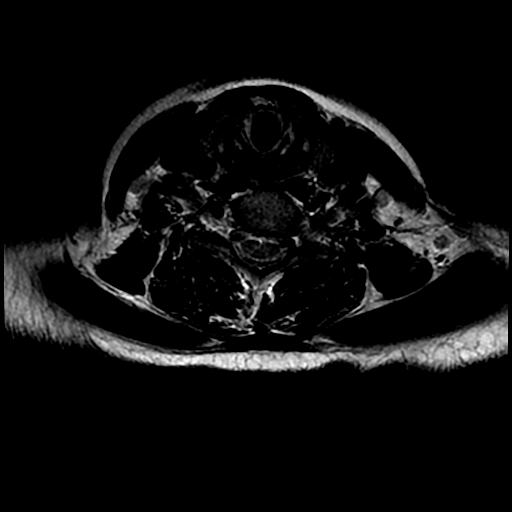
[im 19/33]
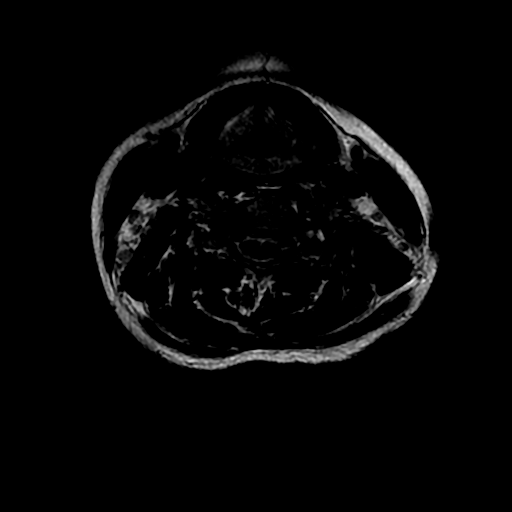
[im 23/33]
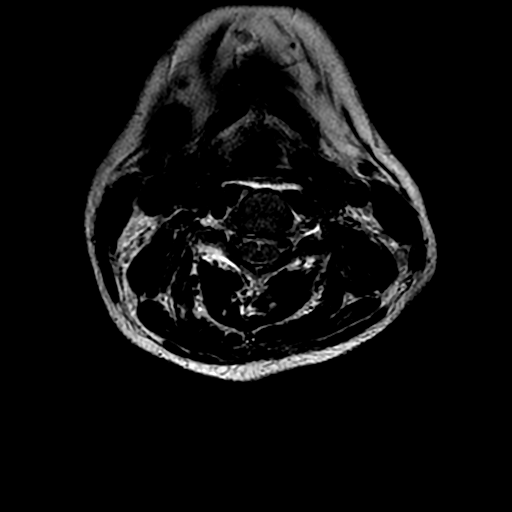
[im 28/33]
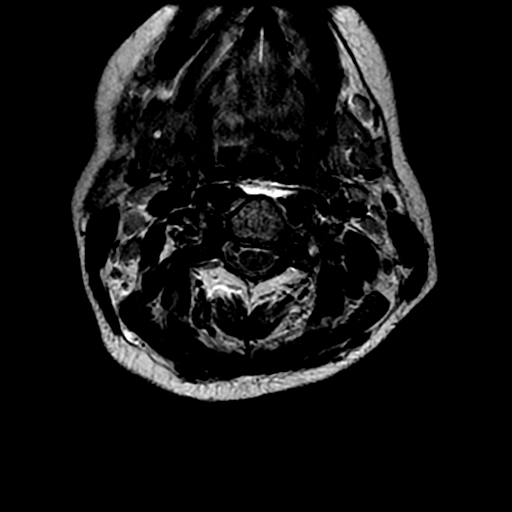
[im 33/33]
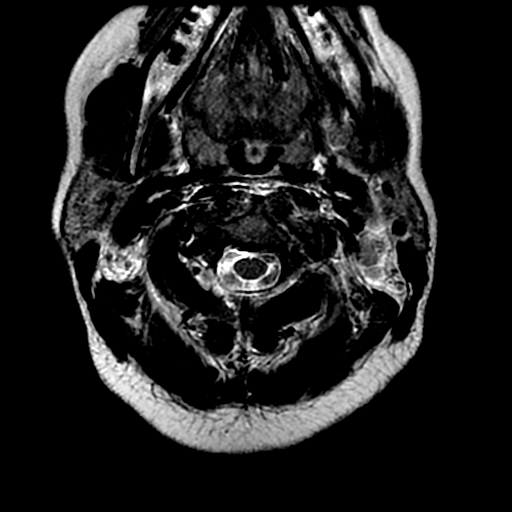

[Series 9: T1 · axial · non-contrast · 3.0mm · 0.35mm/px · z∈[-9,+77]mm · 4 of 33 slices shown]
[im 1/33]
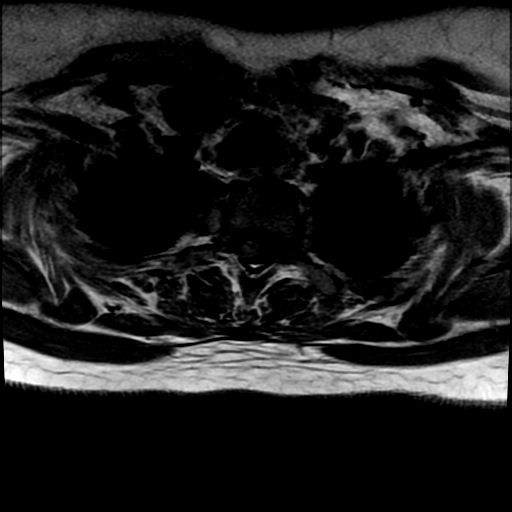
[im 5/33]
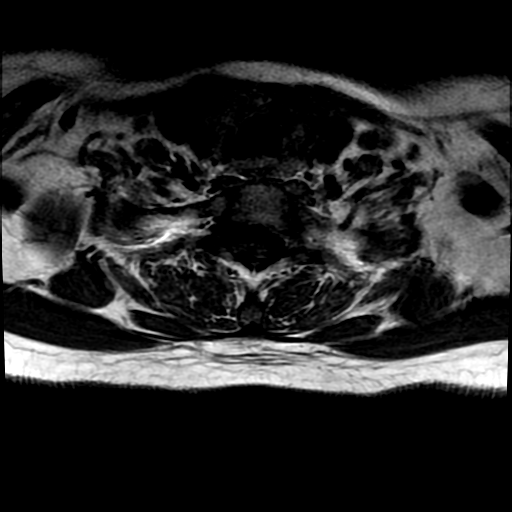
[im 19/33]
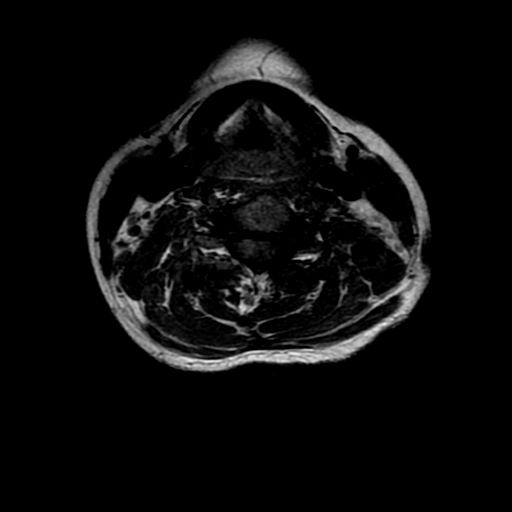
[im 28/33]
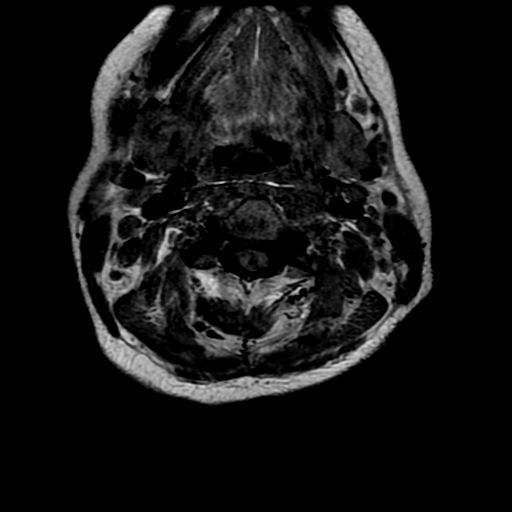

[Series 10: T1 fat-sat post-contrast · sagittal · 3.0mm · 0.43mm/px · 3 of 16 slices shown]
[im 1/16]
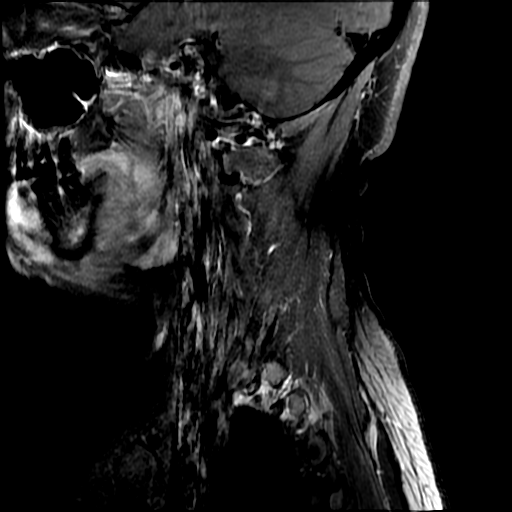
[im 11/16]
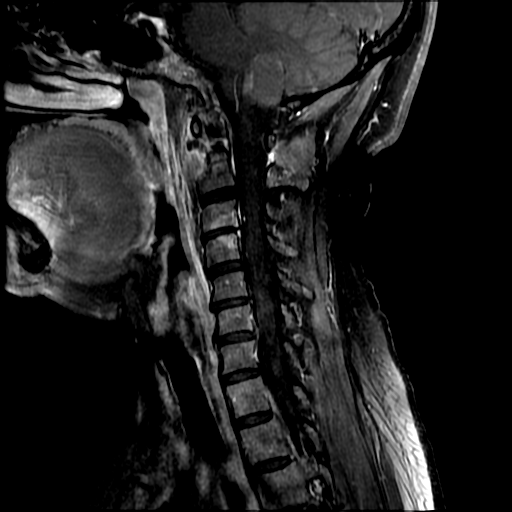
[im 16/16]
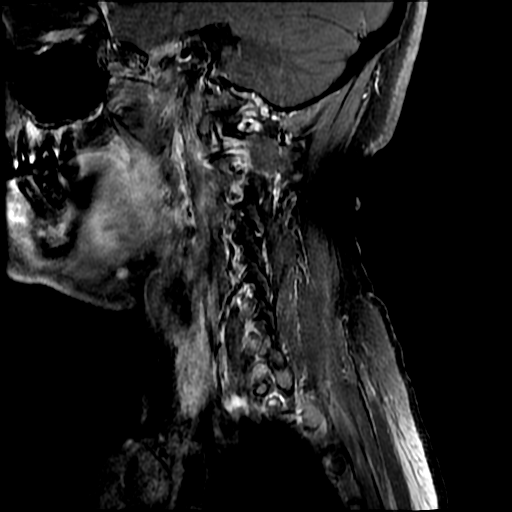

[18 of 48 positions shown; findings below may reference images not displayed]

FINDINGS: Small retropharyngeal effusion re- demonstrated extending from the
C2 level to the C5 level. Mild associated hyper enhancement.
Notably, on today's neck CT there is dystrophic calcification of the
left longus coli muscle just inferior to the C1 attachment. Best
seen on sagittal STIR images there is mild abnormal increased STIR
activity in the superior aspect of the left longus coli muscles
(series 5 images 14 through 16) extending to the left C3 level.
There also appears to be mild hyper enhancement adjacent to the
dystrophic calcification seen on the earlier CT.

The other paraspinal soft tissues are normal.

There is mild endplate marrow edema at C3-C4, but this appears to be
degenerative in nature and there is no intervening abnormal
increased signal or enhancement of the C3-C4 disc. No cervical dural
thickening or abnormal intradural enhancement. Normal bone marrow
signal elsewhere.

Cervicomedullary junction is within normal limits. Spinal cord
signal is within normal limits at all visualized levels. Negative
visualized brain parenchyma.

Chronic cervical disc degeneration at C3-C4, C5-C6, and C6-C7
resulting in up to mild spinal stenosis with no spinal cord mass
effect.
IMPRESSION: 1. Constellation of findings on CT and MRI most compatible with
calcific retropharyngeal tendinitis of the longus Jovanny (AKA acute
calcific prevertebral tendinitis).
2. No evidence of cervical spinal infection.
3. Intermittent cervical spine disc degeneration including at C3-C4
where are associated degenerative marrow signal changes and mild
degenerative spinal stenosis.

## 2018-01-01 ENCOUNTER — Ambulatory Visit: Payer: 59 | Attending: Internal Medicine | Admitting: Internal Medicine

## 2018-01-01 ENCOUNTER — Encounter: Payer: Self-pay | Admitting: Internal Medicine

## 2018-01-01 VITALS — BP 128/78 | HR 69 | Temp 97.4°F | Resp 16 | Ht 66.0 in | Wt 170.0 lb

## 2018-01-01 DIAGNOSIS — N920 Excessive and frequent menstruation with regular cycle: Secondary | ICD-10-CM | POA: Insufficient documentation

## 2018-01-01 DIAGNOSIS — Z881 Allergy status to other antibiotic agents status: Secondary | ICD-10-CM | POA: Insufficient documentation

## 2018-01-01 DIAGNOSIS — Z1159 Encounter for screening for other viral diseases: Secondary | ICD-10-CM

## 2018-01-01 DIAGNOSIS — Z880 Allergy status to penicillin: Secondary | ICD-10-CM | POA: Insufficient documentation

## 2018-01-01 DIAGNOSIS — Z6827 Body mass index (BMI) 27.0-27.9, adult: Secondary | ICD-10-CM | POA: Insufficient documentation

## 2018-01-01 DIAGNOSIS — Z1239 Encounter for other screening for malignant neoplasm of breast: Secondary | ICD-10-CM | POA: Diagnosis not present

## 2018-01-01 DIAGNOSIS — N921 Excessive and frequent menstruation with irregular cycle: Secondary | ICD-10-CM

## 2018-01-01 DIAGNOSIS — R002 Palpitations: Secondary | ICD-10-CM

## 2018-01-01 DIAGNOSIS — D500 Iron deficiency anemia secondary to blood loss (chronic): Secondary | ICD-10-CM | POA: Diagnosis not present

## 2018-01-01 DIAGNOSIS — E663 Overweight: Secondary | ICD-10-CM | POA: Diagnosis not present

## 2018-01-01 DIAGNOSIS — Z1211 Encounter for screening for malignant neoplasm of colon: Secondary | ICD-10-CM

## 2018-01-01 DIAGNOSIS — Z2821 Immunization not carried out because of patient refusal: Secondary | ICD-10-CM

## 2018-01-01 NOTE — Patient Instructions (Signed)

## 2018-01-01 NOTE — Progress Notes (Signed)
Patient is here a as a new patient.   Patient is here to establish care for primary care.

## 2018-01-01 NOTE — Progress Notes (Signed)
Patient ID: Lindsey Cardenas, female    DOB: [birth year]  MRN: [id number]  CC: New Patient (Initial Visit)   Subjective: Lindsey Cardenas is a 57 y.o. female who presents for new pt visit.  Her concerns today include:  Pt with hx of IDA and fibroids.   Her last PCP was Dr. Shelia Media at [facility].  Last saw him was 2 yrs ago.  Hx of IDA requiring iron transfusion x 1 in past. On oral iron which she takes maybe 2 x a wk.  Thought to be due to heavy menses from fibroids.  Still gets menses at the age of 85.  Menses were occurring every month up until about 6 months ago.  In the past 6 months she has been having a cycle about every other month.  Menses last 5 to 7 days with heavy bleeding where she has to change her pads every 2 hours.  Endorses a lot of clots.  Cramps usually on the first day of the cycle.  GYN is Dr. Barkley Boards.  Last saw her about 1 yr ago.  Has an upcoming appt before the end of this year.  -She endorses fatigue but attributes it more so to stress.  Request to have thyroid level checked.  Gives history of being diagnosed with Graves' disease 15 years ago after the birth of her daughter.  Reports that symptoms resolved over several months and she no longer needed medications.  She denies any unexplained weight loss or weight gain at this time.  She gets occasional palpitations.  She gives a strong family history of diabetes.  She does yoga 3-4 times a week.  She has been doing Weight Watchers diet intermittently over the past year.  She drinks Coca-Cola intermittently but not every day.  She does eat fruits.  At one time she was eating fruits, granola bar and [place] yogurt as snacks but found that she was still gaining weight.  She figured out that her portion sizes even with the good foods or too much   Past medical history, social history, family history and past surgical history reviewed.      HM: She declines flu shot and HIV screening.  We discussed colon cancer  screening and she is agreeable to doing the fit test.  She will get her Pap smear done later this year when she sees her gynecologist.  Referred for mammogram. Patient Active Problem List   Diagnosis Date Noted  . Iron deficiency anemia 01/20/2016  . Unspecified deficiency anemia 07/06/2011  . Fibroids 07/06/2011     Current Outpatient Medications on File Prior to Visit  Medication Sig Dispense Refill  . Fe Cbn-Fe Gluc-FA-B12-C-DSS (FERRALET 90) 90-1 MG TABS Take 1 tablet by mouth daily.     No current facility-administered medications on file prior to visit.     Allergies  Allergen Reactions  . Penicillins Hives    Has patient had a PCN reaction causing immediate rash, facial/tongue/throat swelling, SOB or lightheadedness with hypotension:  Unknown Has patient had a PCN reaction causing severe rash involving mucus membranes or skin necrosis: No Has patient had a PCN reaction that required hospitalization No Has patient had a PCN reaction occurring within the last 10 years: No If all of the above answers are "NO", then may proceed with Cephalosporin use.   . Clindamycin/Lincomycin Hives    Social History   Socioeconomic History  . Marital status: Legally Separated    Spouse name: Not on file  .  Number of children: 1  . Years of education: 31  . Highest education level: Not on file  Occupational History  . Occupation: Environmental consultant at [employer]  . Financial resource strain: Not on file  . Food insecurity:    Worry: Not on file    Inability: Not on file  . Transportation needs:    Medical: Not on file    Non-medical: Not on file  Tobacco Use  . Smoking status: Never Smoker  . Smokeless tobacco: Never Used  Substance and Sexual Activity  . Alcohol use: Yes    Comment: occas.  . Drug use: No  . Sexual activity: Yes  Lifestyle  . Physical activity:    Days per week: Not on file    Minutes per session: Not on file  . Stress: Not on file  Relationships  .  Social connections:    Talks on phone: Not on file    Gets together: Not on file    Attends religious service: Not on file    Active member of club or organization: Not on file    Attends meetings of clubs or organizations: Not on file    Relationship status: Not on file  . Intimate partner violence:    Fear of current or ex partner: Not on file    Emotionally abused: Not on file    Physically abused: Not on file    Forced sexual activity: Not on file  Other Topics Concern  . Not on file  Social History Narrative   Patient consumes 3-4 sodas weekly    Family History  Problem Relation Age of Onset  . Cancer Father 74       prostate  . Hypertension Mother   . High Cholesterol Mother   . Cancer Maternal Aunt        breast  . Diabetes Maternal Aunt   . Breast cancer Maternal Aunt   . Diabetes Maternal Grandmother   . Diabetes Maternal Uncle     Past Surgical History:  Procedure Laterality Date  . CESAREAN SECTION  2004  . MYOMECTOMY  2003    ROS: Review of Systems As stated above. PHYSICAL EXAM: BP 128/78   Pulse 69   Temp (!) 97.4 F (36.3 C) (Oral)   Resp 16   Ht 5\' 6"  (1.676 m)   Wt 170 lb (77.1 kg)   LMP  (LMP Unknown)   SpO2 99%   BMI 27.44 kg/m   Wt Readings from Last 3 Encounters:  01/01/18 170 lb (77.1 kg)  01/20/16 172 lb 4.8 oz (78.2 kg)  02/25/15 170 lb (77.1 kg)  BP 128/78  Physical Exam  General appearance - alert, well appearing, middle-aged African-American female and in no distress Mental status - normal mood, behavior, speech, dress, motor activity, and thought processes Eyes - pupils equal and reactive, extraocular eye movements intact Nose - normal and patent, no erythema, discharge or polyps Mouth - mucous membranes moist, pharynx normal without lesions Neck -no thyroid enlargement or nodules appreciated. Lymphatics -no cervical or axillary lymphadenopathy Chest - clear to auscultation, no wheezes, rales or rhonchi, symmetric air  entry Heart - normal rate, regular rhythm, normal S1, S2, no murmurs, rubs, clicks or gallops Extremities - peripheral pulses normal, no pedal edema, no clubbing or cyanosis   ASSESSMENT AND PLAN: 1. Iron deficiency anemia due to chronic blood loss 2. Menorrhagia with irregular cycle - CBC Keep appointment with her GYN later this year.   3.  Over weight Discussed healthy eating habits.  Printed information given. Commended her on doing yoga 3-4 times a week.  Also gave recommendation of doing some form of moderate aerobic activity at least 150 minutes a week. - Comprehensive metabolic panel - Lipid panel - Hemoglobin A1c  4. Breast cancer screening - MM Digital Screening; Future  5. Colon cancer screening - Fecal occult blood, imunochemical([facility]/Sun[facility])  6. Need for hepatitis C screening test - Hepatitis c antibody (reflex)  7. Influenza vaccination declined  8. Palpitations Sounds like she had postpartum thyroiditis and I have discussed that with her. - TSH  Blood pressure in the range to be considered elevated.  We discussed low-salt diet.  Patient was given the opportunity to ask questions.  Patient verbalized understanding of the plan and was able to repeat key elements of the plan.   Orders Placed This Encounter  Procedures  . Fecal occult blood, imunochemical([facility]/Sun[facility])  . MM Digital Screening  . CBC  . Comprehensive metabolic panel  . Lipid panel  . Hepatitis c antibody (reflex)  . TSH  . Hemoglobin A1c     Requested Prescriptions    No prescriptions requested or ordered in this encounter    Return if symptoms worsen or fail to improve.  Karle Plumber, MD, FACP

## 2018-01-02 LAB — HCV COMMENT:

## 2018-01-02 LAB — CBC
HEMATOCRIT: 34.5 % (ref 34.0–46.6)
Hemoglobin: 9.6 g/dL — ABNORMAL LOW (ref 11.1–15.9)
MCH: 19.6 pg — AB (ref 26.6–33.0)
MCHC: 27.8 g/dL — AB (ref 31.5–35.7)
MCV: 70 fL — AB (ref 79–97)
Platelets: 321 10*3/uL (ref 150–450)
RBC: 4.9 x10E6/uL (ref 3.77–5.28)
RDW: 18.5 % — ABNORMAL HIGH (ref 12.3–15.4)
WBC: 6.7 10*3/uL (ref 3.4–10.8)

## 2018-01-02 LAB — COMPREHENSIVE METABOLIC PANEL
A/G RATIO: 1.6 (ref 1.2–2.2)
ALK PHOS: 88 IU/L (ref 39–117)
ALT: 10 IU/L (ref 0–32)
AST: 17 IU/L (ref 0–40)
Albumin: 4.5 g/dL (ref 3.5–5.5)
BILIRUBIN TOTAL: 0.4 mg/dL (ref 0.0–1.2)
BUN/Creatinine Ratio: 21 (ref 9–23)
BUN: 15 mg/dL (ref 6–24)
CHLORIDE: 104 mmol/L (ref 96–106)
CO2: 24 mmol/L (ref 20–29)
Calcium: 9.7 mg/dL (ref 8.7–10.2)
Creatinine, Ser: 0.73 mg/dL (ref 0.57–1.00)
GFR calc Af Amer: 107 mL/min/{1.73_m2} (ref >59)
GFR calc non Af Amer: 93 mL/min/{1.73_m2} (ref >59)
GLOBULIN, TOTAL: 2.9 g/dL (ref 1.5–4.5)
Glucose: 81 mg/dL (ref 65–99)
Potassium: 4.2 mmol/L (ref 3.5–5.2)
SODIUM: 143 mmol/L (ref 134–144)
Total Protein: 7.4 g/dL (ref 6.0–8.5)

## 2018-01-02 LAB — LIPID PANEL
CHOLESTEROL TOTAL: 197 mg/dL (ref 100–199)
Chol/HDL Ratio: 3.3 ratio (ref 0.0–4.4)
HDL: 60 mg/dL (ref >39)
LDL CALC: 121 mg/dL — AB (ref 0–99)
Triglycerides: 78 mg/dL (ref 0–149)
VLDL Cholesterol Cal: 16 mg/dL (ref 5–40)

## 2018-01-02 LAB — HEPATITIS C ANTIBODY (REFLEX)

## 2018-01-02 LAB — TSH: TSH: 4.29 u[IU]/mL (ref 0.450–4.500)

## 2018-01-02 LAB — HEMOGLOBIN A1C
Est. average glucose Bld gHb Est-mCnc: 117 mg/dL
Hgb A1c MFr Bld: 5.7 % — ABNORMAL HIGH (ref 4.8–5.6)

## 2018-01-03 ENCOUNTER — Telehealth: Payer: Self-pay

## 2018-01-03 NOTE — Telephone Encounter (Signed)
Contacted pt to go over lab results pt didn't answer lvm asking pt to give me a call at her earliest convenience   If pt calls back please give results: she is still anemic with hemoglobin level of 9.6 normal levels for women is 12-16. I recommend taking the iron supplement daily. Kidney and LFts nl. Thyroid level nl. Hep C screen negative. LDL cholesterol is 121 with goal being less than 100. diabetes screen is in range for pre-diabetes. Healthy eating habits and regular aerobic exercise as discussed  will help to improve cholesterol level and prevent DM.

## 2018-01-09 ENCOUNTER — Telehealth: Payer: Self-pay | Admitting: Internal Medicine

## 2018-01-09 NOTE — Telephone Encounter (Signed)
Patient called back to get her lab results and wanted a VM to be left if she isn't reached. Please follow up.

## 2018-01-22 DIAGNOSIS — Z1211 Encounter for screening for malignant neoplasm of colon: Secondary | ICD-10-CM | POA: Diagnosis not present

## 2018-01-26 LAB — FECAL OCCULT BLOOD, IMMUNOCHEMICAL: Fecal Occult Bld: NEGATIVE

## 2018-01-27 ENCOUNTER — Telehealth: Payer: Self-pay | Admitting: Internal Medicine

## 2018-01-27 NOTE — Telephone Encounter (Signed)
PC placed to pt this a.m to discuss lab results.  I left message on her VM identifying myself and letting her know that I have sent results to her Mychart account.

## 2018-02-01 ENCOUNTER — Other Ambulatory Visit: Payer: Self-pay | Admitting: Obstetrics and Gynecology

## 2018-02-01 ENCOUNTER — Other Ambulatory Visit (HOSPITAL_COMMUNITY)
Admission: RE | Admit: 2018-02-01 | Discharge: 2018-02-01 | Disposition: A | Discharge status: Home / Self Care | Payer: 59 | Source: Ambulatory Visit | Attending: Obstetrics and Gynecology | Admitting: Obstetrics and Gynecology

## 2018-02-01 DIAGNOSIS — D500 Iron deficiency anemia secondary to blood loss (chronic): Secondary | ICD-10-CM | POA: Diagnosis not present

## 2018-02-01 DIAGNOSIS — Z01411 Encounter for gynecological examination (general) (routine) with abnormal findings: Secondary | ICD-10-CM | POA: Insufficient documentation

## 2018-02-01 DIAGNOSIS — Z01419 Encounter for gynecological examination (general) (routine) without abnormal findings: Secondary | ICD-10-CM | POA: Diagnosis not present

## 2018-02-02 ENCOUNTER — Ambulatory Visit
Admission: RE | Admit: 2018-02-02 | Discharge: 2018-02-02 | Disposition: A | Discharge status: Home / Self Care | Payer: 59 | Source: Ambulatory Visit | Attending: Internal Medicine | Admitting: Internal Medicine

## 2018-02-02 DIAGNOSIS — Z1239 Encounter for other screening for malignant neoplasm of breast: Secondary | ICD-10-CM

## 2018-02-02 DIAGNOSIS — Z1231 Encounter for screening mammogram for malignant neoplasm of breast: Secondary | ICD-10-CM | POA: Diagnosis not present

## 2018-02-05 LAB — CYTOLOGY - PAP
DIAGNOSIS: NEGATIVE
HPV (WINDOPATH): NOT DETECTED

## 2018-02-26 DIAGNOSIS — D500 Iron deficiency anemia secondary to blood loss (chronic): Secondary | ICD-10-CM | POA: Diagnosis not present

## 2018-02-26 DIAGNOSIS — D509 Iron deficiency anemia, unspecified: Secondary | ICD-10-CM | POA: Diagnosis not present

## 2018-02-26 DIAGNOSIS — R6889 Other general symptoms and signs: Secondary | ICD-10-CM | POA: Diagnosis not present

## 2018-02-26 DIAGNOSIS — D649 Anemia, unspecified: Secondary | ICD-10-CM | POA: Diagnosis not present

## 2018-03-05 DIAGNOSIS — D500 Iron deficiency anemia secondary to blood loss (chronic): Secondary | ICD-10-CM | POA: Diagnosis not present

## 2018-03-16 DIAGNOSIS — Z23 Encounter for immunization: Secondary | ICD-10-CM | POA: Diagnosis not present

## 2018-03-19 DIAGNOSIS — Z23 Encounter for immunization: Secondary | ICD-10-CM | POA: Diagnosis not present

## 2018-03-19 DIAGNOSIS — Z7184 Encounter for health counseling related to travel: Secondary | ICD-10-CM | POA: Diagnosis not present

## 2019-07-06 ENCOUNTER — Ambulatory Visit: Payer: Self-pay | Attending: Internal Medicine

## 2019-07-06 DIAGNOSIS — Z23 Encounter for immunization: Secondary | ICD-10-CM

## 2019-07-06 NOTE — Progress Notes (Signed)
   Covid-19 Vaccination Clinic  Name:  Lindsey Cardenas    MRN: [id number] DOB: Nov 18, 1960  07/06/2019  [name] was observed post Covid-19 immunization for 15 minutes without incident. She was provided with Vaccine Information Sheet and instruction to access the V-Safe system.   [name] was instructed to call 911 with any severe reactions post vaccine: Marland Kitchen Difficulty breathing  . Swelling of face and throat  . A fast heartbeat  . A bad rash all over body  . Dizziness and weakness   Immunizations Administered    Name Date Dose VIS Date Route   Pfizer COVID-19 Vaccine 07/06/2019  8:45 AM 0.3 mL 03/08/2019 Intramuscular   Manufacturer: [place]   Lot: [id number]   [place]: [id number]

## 2019-07-30 ENCOUNTER — Ambulatory Visit: Payer: Self-pay | Attending: Internal Medicine

## 2019-07-30 DIAGNOSIS — Z23 Encounter for immunization: Secondary | ICD-10-CM

## 2019-07-30 NOTE — Progress Notes (Signed)
   Covid-19 Vaccination Clinic  Name:  Berit Kinderman    MRN: [id number] DOB: 1960-07-25  07/30/2019  Ms. Cocroft was observed post Covid-19 immunization for 15 minutes without incident. She was provided with Vaccine Information Sheet and instruction to access the V-Safe system.   Ms. Gulden was instructed to call 911 with any severe reactions post vaccine: Marland Kitchen Difficulty breathing  . Swelling of face and throat  . A fast heartbeat  . A bad rash all over body  . Dizziness and weakness   Immunizations Administered    Name Date Dose VIS Date Route   Pfizer COVID-19 Vaccine 07/30/2019  9:23 AM 0.3 mL 05/22/2018 Intramuscular   Manufacturer: [place]   Lot: [id number]   [place]: [id number]

## 2019-11-05 ENCOUNTER — Other Ambulatory Visit: Payer: Self-pay | Admitting: Obstetrics and Gynecology

## 2019-11-05 DIAGNOSIS — Z1231 Encounter for screening mammogram for malignant neoplasm of breast: Secondary | ICD-10-CM

## 2019-11-21 ENCOUNTER — Ambulatory Visit: Payer: Self-pay

## 2019-11-29 ENCOUNTER — Other Ambulatory Visit: Payer: Self-pay

## 2019-11-29 ENCOUNTER — Ambulatory Visit
Admission: RE | Admit: 2019-11-29 | Discharge: 2019-11-29 | Disposition: A | Discharge status: Home / Self Care | Payer: Self-pay | Source: Ambulatory Visit | Attending: Obstetrics and Gynecology | Admitting: Obstetrics and Gynecology

## 2019-11-29 DIAGNOSIS — Z1231 Encounter for screening mammogram for malignant neoplasm of breast: Secondary | ICD-10-CM

## 2020-08-12 ENCOUNTER — Other Ambulatory Visit: Payer: Self-pay | Admitting: Family Medicine

## 2020-08-12 DIAGNOSIS — R195 Other fecal abnormalities: Secondary | ICD-10-CM

## 2020-08-12 DIAGNOSIS — R1013 Epigastric pain: Secondary | ICD-10-CM

## 2020-08-12 DIAGNOSIS — R1110 Vomiting, unspecified: Secondary | ICD-10-CM

## 2020-08-13 ENCOUNTER — Ambulatory Visit
Admission: RE | Admit: 2020-08-13 | Discharge: 2020-08-13 | Disposition: A | Discharge status: Home / Self Care | Payer: Managed Care, Other (non HMO) | Source: Ambulatory Visit | Attending: Family Medicine | Admitting: Family Medicine

## 2020-08-13 DIAGNOSIS — R195 Other fecal abnormalities: Secondary | ICD-10-CM

## 2020-08-13 DIAGNOSIS — R1013 Epigastric pain: Secondary | ICD-10-CM

## 2020-08-13 DIAGNOSIS — R1110 Vomiting, unspecified: Secondary | ICD-10-CM

## 2020-08-16 ENCOUNTER — Encounter (HOSPITAL_COMMUNITY): Payer: Self-pay | Admitting: Emergency Medicine

## 2020-08-16 ENCOUNTER — Emergency Department (HOSPITAL_COMMUNITY)
Admission: EM | Admit: 2020-08-16 | Discharge: 2020-08-16 | Disposition: A | Discharge status: Home / Self Care | Payer: Managed Care, Other (non HMO) | Attending: Emergency Medicine | Admitting: Emergency Medicine

## 2020-08-16 ENCOUNTER — Other Ambulatory Visit: Payer: Self-pay

## 2020-08-16 ENCOUNTER — Emergency Department (HOSPITAL_COMMUNITY): Payer: Managed Care, Other (non HMO)

## 2020-08-16 DIAGNOSIS — K8020 Calculus of gallbladder without cholecystitis without obstruction: Secondary | ICD-10-CM | POA: Diagnosis not present

## 2020-08-16 DIAGNOSIS — R109 Unspecified abdominal pain: Secondary | ICD-10-CM

## 2020-08-16 LAB — CBC WITH DIFFERENTIAL/PLATELET
Abs Immature Granulocytes: 0.01 10*3/uL (ref 0.00–0.07)
Basophils Absolute: 0 10*3/uL (ref 0.0–0.1)
Basophils Relative: 1 %
Eosinophils Absolute: 0 10*3/uL (ref 0.0–0.5)
Eosinophils Relative: 0 %
HCT: 45.4 % (ref 36.0–46.0)
Hemoglobin: 14.2 g/dL (ref 12.0–15.0)
Immature Granulocytes: 0 %
Lymphocytes Relative: 11 %
Lymphs Abs: 1 10*3/uL (ref 0.7–4.0)
MCH: 27.5 pg (ref 26.0–34.0)
MCHC: 31.3 g/dL (ref 30.0–36.0)
MCV: 88 fL (ref 80.0–100.0)
Monocytes Absolute: 0.4 10*3/uL (ref 0.1–1.0)
Monocytes Relative: 4 %
Neutro Abs: 7.2 10*3/uL (ref 1.7–7.7)
Neutrophils Relative %: 84 %
Platelets: 225 10*3/uL (ref 150–400)
RBC: 5.16 MIL/uL — ABNORMAL HIGH (ref 3.87–5.11)
RDW: 12.6 % (ref 11.5–15.5)
WBC: 8.6 10*3/uL (ref 4.0–10.5)
nRBC: 0 % (ref 0.0–0.2)

## 2020-08-16 LAB — COMPREHENSIVE METABOLIC PANEL
ALT: 19 U/L (ref 0–44)
AST: 25 U/L (ref 15–41)
Albumin: 4 g/dL (ref 3.5–5.0)
Alkaline Phosphatase: 93 U/L (ref 38–126)
Anion gap: 10 (ref 5–15)
BUN: 11 mg/dL (ref 6–20)
CO2: 26 mmol/L (ref 22–32)
Calcium: 9.6 mg/dL (ref 8.9–10.3)
Chloride: 104 mmol/L (ref 98–111)
Creatinine, Ser: 0.72 mg/dL (ref 0.44–1.00)
GFR, Estimated: >60 mL/min (ref >60)
Glucose, Bld: 90 mg/dL (ref 70–99)
Potassium: 4 mmol/L (ref 3.5–5.1)
Sodium: 140 mmol/L (ref 135–145)
Total Bilirubin: 0.8 mg/dL (ref 0.3–1.2)
Total Protein: 8.1 g/dL (ref 6.5–8.1)

## 2020-08-16 LAB — URINALYSIS, ROUTINE W REFLEX MICROSCOPIC
Bilirubin Urine: NEGATIVE
Glucose, UA: NEGATIVE mg/dL
Hgb urine dipstick: NEGATIVE
Ketones, ur: 20 mg/dL — AB
Leukocytes,Ua: NEGATIVE
Nitrite: NEGATIVE
Protein, ur: NEGATIVE mg/dL
Specific Gravity, Urine: 1.021 (ref 1.005–1.030)
pH: 5 (ref 5.0–8.0)

## 2020-08-16 LAB — I-STAT BETA HCG BLOOD, ED (MC, WL, AP ONLY): I-stat hCG, quantitative: <5 m[IU]/mL (ref <5)

## 2020-08-16 LAB — LIPASE, BLOOD: Lipase: 26 U/L (ref 11–51)

## 2020-08-16 MED ORDER — MORPHINE SULFATE (PF) 4 MG/ML IV SOLN
4.0000 mg | Freq: Once | INTRAVENOUS | Status: AC
  Administered 2020-08-16: 4 mg via INTRAVENOUS
  Filled 2020-08-16: qty 1

## 2020-08-16 MED ORDER — ONDANSETRON 4 MG PO TBDP: 4.0000 mg | ORAL_TABLET | Freq: Three times a day (TID) | ORAL | 0 refills | Status: DC | PRN

## 2020-08-16 MED ORDER — HYDROCODONE-ACETAMINOPHEN 5-325 MG PO TABS: 1.0000 | ORAL_TABLET | ORAL | 0 refills | Status: DC | PRN

## 2020-08-16 MED ORDER — ONDANSETRON HCL 4 MG/2ML IJ SOLN
4.0000 mg | Freq: Once | INTRAMUSCULAR | Status: AC
  Administered 2020-08-16: 4 mg via INTRAVENOUS
  Filled 2020-08-16: qty 2

## 2020-08-16 MED ORDER — SODIUM CHLORIDE 0.9 % IV BOLUS
1000.0000 mL | Freq: Once | INTRAVENOUS | Status: AC
  Administered 2020-08-16: 1000 mL via INTRAVENOUS

## 2020-08-16 NOTE — Discharge Instructions (Addendum)
Avoid fatty and greasy food.

## 2020-08-16 NOTE — ED Provider Notes (Signed)
Emergency Medicine Provider Triage Evaluation Note  Lindsey Cardenas 60 y.o. female was evaluated in triage.  Pt complains of right upper quadrant abdominal pain, nausea/vomiting that began this morning.  Reports she had similar symptoms last week and saw her primary care doctor.  They did an ultrasound last week that showed gallstones.  She has pain and improved until this morning.  She states that she has tried heat which is sometimes helped it but his pain is persisted.  She has not noted any fevers.  No chest pain, difficulty breathing, urinary complaints.   Review of Systems  Positive: Right upper quadrant abdominal pain, nausea/vomiting. Negative: Fevers, chest pain, difficulty breathing.  Physical Exam  BP 134/82   Pulse 70   Temp 98.2 F (36.8 C) (Oral)   Resp 18   Ht 5\' 4"  (1.626 m)   Wt 65.8 kg   SpO2 100%   BMI 24.89 kg/m  Gen:   Awake, no distress   HEENT:  Atraumatic  Resp:  Normal effort  Cardiac:  Normal rate  Abd:   Nondistended, tenderness palpation the right upper quadrant.  No rigidity, guarding.  No CVA tenderness. MSK:   Moves extremities without difficulty  Neuro:  Speech clear   Other:      Medical Decision Making  Medically screening exam initiated at 11:47 AM  Appropriate orders placed.  Lindsey Cardenas was informed that the remainder of the evaluation will be completed by another provider, this initial triage assessment does not replace that evaluation. They are counseled that they will need to remain in the ED until the completion of their workup, including full H&P and results of any tests.  Risks of leaving the emergency department prior to completion of treatment were discussed. Patient was advised to inform ED staff if they are leaving before their treatment is complete. The patient acknowledged these risks and time was allowed for questions.     The patient appears stable so that the remainder of the MSE may be completed by another  provider.   Clinical Impression  Abdominal pain.   Portions of this note were generated with Lobbyist. Dictation errors may occur despite best attempts at proofreading.      Volanda Napoleon, PA-C 08/16/20 1147    Isla Pence, MD 08/16/20 1155

## 2020-08-16 NOTE — ED Triage Notes (Signed)
Pt diagnosed with gallstones last week. Better until this morning, complaint of upper abdominal pain. A&Ox4. VSS.

## 2020-08-16 NOTE — ED Provider Notes (Signed)
[facility] EMERGENCY DEPARTMENT Provider Note   CSN: [id number] Arrival date & time: 08/16/20  1126     History Chief Complaint  Patient presents with  . Abdominal Pain    Lindsey Cardenas is a 60 y.o. female.  Pt presents to the ED today with abdominal pain.  Pt was diagnosed with gallstones last week by [place].  She said the pain has not been too bad. Her pcp referred her to surgery on Friday, 5/20, but she has not received an appt yet.  Pt said she woke up today with n/v and RUQ pain.          Past Medical History:  Diagnosis Date  . Anxiety   . Depression   . Stress incontinence     Patient Active Problem List   Diagnosis Date Noted  . Menorrhagia with irregular cycle 01/01/2018  . Over weight 01/01/2018  . Iron deficiency anemia 01/20/2016  . Unspecified deficiency anemia 07/06/2011  . Fibroids 07/06/2011    Past Surgical History:  Procedure Laterality Date  . CESAREAN SECTION  2004  . MYOMECTOMY  2003     OB History   No obstetric history on file.     Family History  Problem Relation Age of Onset  . Cancer Father 87       prostate  . Hypertension Mother   . High Cholesterol Mother   . Cancer Maternal Aunt        breast  . Diabetes Maternal Aunt   . Breast cancer Maternal Aunt   . Diabetes Maternal Grandmother   . Diabetes Maternal Uncle     Social History   Tobacco Use  . Smoking status: Never Smoker  . Smokeless tobacco: Never Used  Vaping Use  . Vaping Use: Never used  Substance Use Topics  . Alcohol use: Yes    Comment: occas.  . Drug use: No    Home Medications Prior to Admission medications   Medication Sig Start Date End Date Taking? Authorizing Provider  HYDROcodone-acetaminophen (NORCO/VICODIN) 5-325 MG tablet Take 1 tablet by mouth every 4 (four) hours as needed. 08/16/20  Yes Isla Pence, MD  ondansetron (ZOFRAN ODT) 4 MG disintegrating tablet Take 1 tablet (4 mg total) by mouth every 8 (eight) hours as  needed for nausea or vomiting. 08/16/20  Yes Isla Pence, MD  Fe Cbn-Fe Gluc-FA-B12-C-DSS (FERRALET 90) 90-1 MG TABS Take 1 tablet by mouth daily.    [provider]    Allergies    Penicillins and Clindamycin/lincomycin  Review of Systems   Review of Systems  Gastrointestinal: Positive for abdominal pain, nausea and vomiting.  All other systems reviewed and are negative.   Physical Exam Updated Vital Signs BP (!) 163/86   Pulse 62   Temp 98.2 F (36.8 C) (Oral)   Resp 17   LMP 01/23/2018   SpO2 100%   Physical Exam Vitals and nursing note reviewed.  Constitutional:      Appearance: She is well-developed.  HENT:     Head: Normocephalic and atraumatic.     Mouth/Throat:     Mouth: Mucous membranes are dry.  Eyes:     Extraocular Movements: Extraocular movements intact.     Pupils: Pupils are equal, round, and reactive to light.  Cardiovascular:     Rate and Rhythm: Normal rate and regular rhythm.  Pulmonary:     Effort: Pulmonary effort is normal.     Breath sounds: Normal breath sounds.  Abdominal:  General: Abdomen is flat. Bowel sounds are normal.     Palpations: Abdomen is soft.     Tenderness: There is abdominal tenderness in the right upper quadrant.  Skin:    General: Skin is warm.     Capillary Refill: Capillary refill takes less than 2 seconds.  Neurological:     General: No focal deficit present.     Mental Status: She is alert and oriented to person, place, and time.  Psychiatric:        Mood and Affect: Mood normal.        Behavior: Behavior normal.     ED Results / Procedures / Treatments   Labs (all labs ordered are listed, but only abnormal results are displayed) Labs Reviewed  URINALYSIS, ROUTINE W REFLEX MICROSCOPIC - Abnormal; Notable for the following components:      Result Value   APPearance HAZY (*)    Ketones, ur 20 (*)    All other components within normal limits  CBC WITH DIFFERENTIAL/PLATELET - Abnormal; Notable  for the following components:   RBC 5.16 (*)    All other components within normal limits  COMPREHENSIVE METABOLIC PANEL  LIPASE, BLOOD  CBC WITH DIFFERENTIAL/PLATELET  I-STAT BETA HCG BLOOD, ED ([facility], [facility], [facility] ONLY)    EKG None  Radiology US Abdomen Limited RUQ (LIVER/GB)  Result Date: 08/16/2020 CLINICAL DATA:  Abdominal pain. EXAM: ULTRASOUND ABDOMEN LIMITED RIGHT UPPER QUADRANT COMPARISON:  08/13/2020 from [place]: Gallbladder: Multiple small gallstones are seen, largest measuring 1.8 cm. No evidence of gallbladder dilatation or abnormal wall thickening on today's study. No sonographic Murphy sign noted by sonographer. Common bile duct: Diameter: 4 mm, within normal limits. Liver: No focal lesion identified. Within normal limits in parenchymal echogenicity. Portal vein is patent on color Doppler imaging with normal direction of blood flow towards the liver. Other: None. IMPRESSION: Cholelithiasis, without definite sonographic signs of cholecystitis on today's exam. No other hepatobiliary abnormality identified. Electronically Signed   By: Marlaine Hind M.D.   On: 08/16/2020 12:45    Procedures Procedures   Medications Ordered in ED Medications  sodium chloride 0.9 % bolus 1,000 mL (1,000 mLs Intravenous New Bag/Given 08/16/20 1428)  ondansetron (ZOFRAN) injection 4 mg (4 mg Intravenous Given 08/16/20 1429)  morphine 4 MG/ML injection 4 mg (4 mg Intravenous Given 08/16/20 1429)    ED Course  I have reviewed the triage vital signs and the nursing notes.  Pertinent labs & imaging results that were available during my care of the patient were reviewed by me and considered in my medical decision making (see chart for details).    MDM Rules/Calculators/A&P                          Pt is feeling much better.  She thinks she can go home.  She has a child who is graduating from middle college at [employer] on Wednesday the 25th.  She wants to make it to that graduation.  She  knows to avoid fatty and greasy foods.  She is to f/u with Dr. Barry Dienes.  Return if worse.  Final Clinical Impression(s) / ED Diagnoses Final diagnoses:  Abdominal pain  Calculus of gallbladder without cholecystitis without obstruction    Rx / DC Orders ED Discharge Orders         Ordered    ondansetron (ZOFRAN ODT) 4 MG disintegrating tablet  Every 8 hours PRN  08/16/20 1619    HYDROcodone-acetaminophen (NORCO/VICODIN) 5-325 MG tablet  Every 4 hours PRN        08/16/20 1619           Isla Pence, MD 08/16/20 1621

## 2020-08-16 NOTE — ED Notes (Signed)
Discharge instructions reviewed and explained, pt verbalized understanding.

## 2021-02-09 ENCOUNTER — Other Ambulatory Visit: Payer: Self-pay | Admitting: Family Medicine

## 2021-02-09 ENCOUNTER — Other Ambulatory Visit: Payer: Self-pay | Admitting: Obstetrics and Gynecology

## 2021-02-09 DIAGNOSIS — Z1231 Encounter for screening mammogram for malignant neoplasm of breast: Secondary | ICD-10-CM

## 2021-03-16 ENCOUNTER — Ambulatory Visit: Payer: Managed Care, Other (non HMO)

## 2021-03-17 ENCOUNTER — Other Ambulatory Visit: Payer: Self-pay | Admitting: Family Medicine

## 2021-03-17 DIAGNOSIS — Z1231 Encounter for screening mammogram for malignant neoplasm of breast: Secondary | ICD-10-CM

## 2021-04-13 ENCOUNTER — Ambulatory Visit
Admission: RE | Admit: 2021-04-13 | Discharge: 2021-04-13 | Disposition: A | Discharge status: Home / Self Care | Payer: Managed Care, Other (non HMO) | Source: Ambulatory Visit

## 2021-04-13 DIAGNOSIS — Z1231 Encounter for screening mammogram for malignant neoplasm of breast: Secondary | ICD-10-CM

## 2021-07-01 ENCOUNTER — Other Ambulatory Visit: Payer: Self-pay | Admitting: Obstetrics and Gynecology

## 2022-06-27 ENCOUNTER — Other Ambulatory Visit: Payer: Self-pay | Admitting: Family Medicine

## 2022-06-27 DIAGNOSIS — Z0000 Encounter for general adult medical examination without abnormal findings: Secondary | ICD-10-CM

## 2022-07-20 ENCOUNTER — Encounter: Payer: Self-pay | Admitting: Oncology

## 2022-07-27 ENCOUNTER — Ambulatory Visit
Admission: RE | Admit: 2022-07-27 | Discharge: 2022-07-27 | Disposition: A | Discharge status: Home / Self Care | Payer: Commercial Managed Care - HMO | Source: Ambulatory Visit | Attending: Family Medicine | Admitting: Family Medicine

## 2022-07-27 DIAGNOSIS — Z0000 Encounter for general adult medical examination without abnormal findings: Secondary | ICD-10-CM

## 2022-11-25 ENCOUNTER — Encounter: Payer: Self-pay | Admitting: Oncology

## 2022-12-13 ENCOUNTER — Ambulatory Visit: Payer: Commercial Managed Care - HMO

## 2022-12-13 ENCOUNTER — Encounter: Payer: Self-pay | Admitting: Podiatry

## 2022-12-13 ENCOUNTER — Ambulatory Visit: Payer: Commercial Managed Care - HMO | Admitting: Podiatry

## 2022-12-13 DIAGNOSIS — M722 Plantar fascial fibromatosis: Secondary | ICD-10-CM

## 2022-12-13 MED ORDER — TRIAMCINOLONE ACETONIDE 40 MG/ML IJ SUSP
20.0000 mg | Freq: Once | INTRAMUSCULAR | Status: AC
  Administered 2022-12-13: 20 mg

## 2022-12-13 MED ORDER — METHYLPREDNISOLONE 4 MG PO TBPK: ORAL_TABLET | ORAL | 0 refills | Status: DC

## 2022-12-13 MED ORDER — MELOXICAM 15 MG PO TABS: 15.0000 mg | ORAL_TABLET | Freq: Every day | ORAL | 3 refills | Status: DC

## 2022-12-13 NOTE — Progress Notes (Signed)
Subjective:  Patient ID: Lindsey Cardenas, female    DOB: 1960-10-22,  MRN: [id number] HPI Chief Complaint  Patient presents with   Foot Pain    Plantar heel left and lateral side left - aching x few weeks, AM pain, PCP xrayed (not viewable in EPIC, but patient doesn't want more done), icing some-helps a little   New Patient (Initial Visit)    62 y.o. female presents with the above complaint.   ROS: Denies fever chills nausea vomit muscle aches pains calf pain back pain chest pain shortness of breath.  Past Medical History:  Diagnosis Date   Anxiety    Depression    Stress incontinence    Past Surgical History:  Procedure Laterality Date   CESAREAN SECTION  2004   MYOMECTOMY  2003    Current Outpatient Medications:    meloxicam (MOBIC) 15 MG tablet, Take 1 tablet (15 mg total) by mouth daily., Disp: 30 tablet, Rfl: 3   methylPREDNISolone (MEDROL DOSEPAK) 4 MG TBPK tablet, 6 day dose pack - take as directed, Disp: 21 tablet, Rfl: 0  Allergies  Allergen Reactions   Penicillins Hives    Has patient had a PCN reaction causing immediate rash, facial/tongue/throat swelling, SOB or lightheadedness with hypotension:  Unknown Has patient had a PCN reaction causing severe rash involving mucus membranes or skin necrosis: No Has patient had a PCN reaction that required hospitalization No Has patient had a PCN reaction occurring within the last 10 years: No If all of the above answers are "NO", then may proceed with Cephalosporin use.    Clindamycin/Lincomycin Hives   Review of Systems Objective:  There were no vitals filed for this visit.  General: Well developed, nourished, in no acute distress, alert and oriented x3   Dermatological: Skin is warm, dry and supple bilateral. Nails x 10 are well maintained; remaining integument appears unremarkable at this time. There are no open sores, no preulcerative lesions, no rash or signs of infection present.  Vascular: Dorsalis Pedis  artery and Posterior Tibial artery pedal pulses are 2/4 bilateral with immedate capillary fill time. Pedal hair growth present. No varicosities and no lower extremity edema present bilateral.   Neruologic: Grossly intact via light touch bilateral. Vibratory intact via tuning fork bilateral. Protective threshold with Semmes Wienstein monofilament intact to all pedal sites bilateral. Patellar and Achilles deep tendon reflexes 2+ bilateral. No Babinski or clonus noted bilateral.   Musculoskeletal: No gross boney pedal deformities bilateral. No pain, crepitus, or limitation noted with foot and ankle range of motion bilateral. Muscular strength 5/5 in all groups tested bilateral.  She has moderate severe pain on palpation medial calcaneal tubercle of her left heel.  Some tenderness overlying the fifth metatarsal mid diaphyseal region left foot.  Gait: Unassisted, Nonantalgic.    Radiographs:  She did not bring the results with her.  Assessment & Plan:   Assessment: Edema fifth metatarsal shaft and plantar fasciitis left.  Plan: Injected the left heel today 20 mg Kenalog 5 mg Marcaine started on methylprednisolone to be followed by meloxicam placed her in a plantar fascial brace.  Discussed appropriate shoe gear stretching sizes ice therapy and sugar modifications.  Follow-up with her in 1 month     Kaleigh Spiegelman T. [place], [place]

## 2022-12-13 NOTE — Patient Instructions (Signed)

## 2023-01-12 ENCOUNTER — Ambulatory Visit: Payer: Commercial Managed Care - HMO | Admitting: Podiatry

## 2023-02-07 ENCOUNTER — Encounter: Payer: Self-pay | Admitting: Oncology

## 2023-02-14 ENCOUNTER — Encounter: Payer: Self-pay | Admitting: Internal Medicine

## 2023-02-14 ENCOUNTER — Ambulatory Visit: Payer: Managed Care, Other (non HMO) | Attending: Cardiovascular Disease | Admitting: Internal Medicine

## 2023-02-14 VITALS — BP 130/86 | HR 72 | Ht 64.0 in | Wt 190.2 lb

## 2023-02-14 DIAGNOSIS — I480 Paroxysmal atrial fibrillation: Secondary | ICD-10-CM

## 2023-02-14 MED ORDER — METOPROLOL TARTRATE 25 MG PO TABS: 25.0000 mg | ORAL_TABLET | Freq: Two times a day (BID) | ORAL | 1 refills | Status: DC | PRN

## 2023-02-14 NOTE — Progress Notes (Signed)
  Cardiology Office Note:  .   Date:  02/14/2023  ID:  Lindsey Cardenas, DOB [birth year], MRN [id number] PCP: Irena Reichmann, DO  [facility] Providers Cardiologist:  None    History of Present Illness: .   Lindsey Cardenas is a 62 y.o. female referred here by Dr. Thomasena Edis for Dr. Duke Salvia, she is here for new onset atrial paroxysmal afib. She reported heart racing.  He had a ziopatch for just under 2 weeks. Had <1 % afib. Duke-She had an echo in 02/2012, normal LV fxn, no significant valve disease. She had a monitor in 2004, had palpitations.   She denies history of sleep apnea.  She denies any signs at this . No heavy etoh use.  Normal renal function  She has hx of thyoid dx TSH 10.5 10/12/2022  No cardiac disease history  ROS:  per HPI otherwise negative   Studies Reviewed: Marland Kitchen   EKG Interpretation Date/Time:  Tuesday February 14 2023 15:14:22 EST Ventricular Rate:  72 PR Interval:  162 QRS Duration:  88 QT Interval:  392 QTC Calculation: 429 R Axis:   -55  Text Interpretation: Normal sinus rhythm Left anterior fascicular block Moderate voltage criteria for LVH, may be normal variant ( R in aVL , Cornell product ) No previous ECGs available Confirmed by Carolan Clines ([id number]) on 02/14/2023 3:20:29 PM    Risk Assessment/Calculations:    CHA2DS2-VASc Score = 1   This indicates a 0.6% annual risk of stroke. The patient's score is based upon: CHF History: 0 HTN History: 0 Diabetes History: 0 Stroke History: 0 Vascular Disease History: 0 Age Score: 0 Gender Score: 1          Physical Exam:   VS: Vitals:   02/14/23 1509  BP: 130/86  Pulse: 72  SpO2: 95%       BP 130/86 (BP Location: Right Arm, Patient Position: Sitting, Cuff Size: Normal)   Pulse 72   Ht 5\' 4"  (1.626 m)   Wt 190 lb 3.2 oz (86.3 kg)   LMP 01/23/2018   SpO2 95%   BMI 32.65 kg/m    Wt Readings from Last 3 Encounters:  02/14/23 190 lb 3.2 oz (86.3 kg)  01/01/18 170 lb (77.1 kg)  01/20/16 172  lb 4.8 oz (78.2 kg)    GEN: Well nourished, well developed in no acute distress NECK: No JVD; No carotid bruits CARDIAC: RRR, no murmurs, rubs, gallops RESPIRATORY:  Clear to auscultation without rales, wheezing or rhonchi  ABDOMEN: Soft, non-tender, non-distended EXTREMITIES:  No edema; No deformity   ASSESSMENT AND PLAN: .   Paroxysmal atrial fibrillation Suspect in the setting of thyroid disease - in sinus rhythm -Will repeat an echocardiogram to assess options for AAD if needed.  -Start metoprolol 25 mg tartrate BID PRN; per patient preference -Her CHA2DS2-VASc is equal to 1; therefore her stroke risk is very low will not start anticoagulation.  Will follow-up her echocardiogram to ensure -We discussed that if she notes heart rates greater than 120 for 12 to 24 hours to let us know to be evaluated.  Otherwise we discussed purchasing Kardia mobile.  We also discussed contingency plans in case she has recurrence of A-fib.       Thyroid dx hx TSH 10.5 Repeat TSH, Free/Total T3/T4  Dispo:  Follow-up in 3 months  Signed, Tobby Fawcett, Alben Spittle, MD

## 2023-02-14 NOTE — Patient Instructions (Signed)
Medication Instructions:  START Metoprolol Tartrate 25mg  Take 1 tablet twice a day as needed for palpitations *If you need a refill on your cardiac medications before your next appointment, please call your pharmacy*   Lab Work: TODAY-TSH, FREE T3, FREE T4, TOTAL T3 If you have labs (blood work) drawn today and your tests are completely normal, you will receive your results only by: MyChart Message (if you have MyChart) OR A paper copy in the mail If you have any lab test that is abnormal or we need to change your treatment, we will call you to review the results.   Testing/Procedures: Your physician has requested that you have an echocardiogram. Echocardiography is a painless test that uses sound waves to create images of your heart. It provides your doctor with information about the size and shape of your heart and how well your heart's chambers and valves are working. This procedure takes approximately one hour. There are no restrictions for this procedure. Please do NOT wear cologne, perfume, aftershave, or lotions (deodorant is allowed). Please arrive 15 minutes prior to your appointment time.  Please note: We ask at that you not bring children with you during ultrasound (echo/ vascular) testing. Due to room size and safety concerns, children are not allowed in the ultrasound rooms during exams. Our front office staff cannot provide observation of children in our lobby area while testing is being conducted. An adult accompanying a patient to their appointment will only be allowed in the ultrasound room at the discretion of the ultrasound technician under special circumstances. We apologize for any inconvenience.   Follow-Up: At [facility], you and your health needs are our priority.  As part of our continuing mission to provide you with exceptional heart care, we have created designated Provider Care Teams.  These Care Teams include your primary Cardiologist (physician) and  Advanced Practice Providers (APPs -  Physician Assistants and Nurse Practitioners) who all work together to provide you with the care you need, when you need it.  We recommend signing up for the patient portal called "MyChart".  Sign up information is provided on this After Visit Summary.  MyChart is used to connect with patients for Virtual Visits (Telemedicine).  Patients are able to view lab/test results, encounter notes, upcoming appointments, etc.  Non-urgent messages can be sent to your provider as well.   To learn more about what you can do with MyChart, go to [email or web address].    Your next appointment:   3 month(s)  Provider:   Maisie Fus, MD Other Instructions

## 2023-02-27 LAB — TSH+T3+FREE T4+T3 FREE
Free T-3: 2.8 pg/mL
Free T4 by Dialysis: 1.1 ng/dL
TSH: 6.5 uU/mL — ABNORMAL HIGH
Triiodothyronine (T-3), Serum: 96 ng/dL

## 2023-03-17 ENCOUNTER — Encounter: Payer: Self-pay | Admitting: Oncology

## 2023-03-24 ENCOUNTER — Ambulatory Visit (HOSPITAL_COMMUNITY): Payer: Commercial Managed Care - HMO | Attending: Internal Medicine

## 2023-03-24 DIAGNOSIS — I480 Paroxysmal atrial fibrillation: Secondary | ICD-10-CM | POA: Diagnosis present

## 2023-03-24 LAB — ECHOCARDIOGRAM COMPLETE
Area-P 1/2: 3.29 cm2
S' Lateral: 2.6 cm

## 2023-03-24 MED ORDER — PERFLUTREN LIPID MICROSPHERE
1.0000 mL | INTRAVENOUS | Status: AC | PRN
  Administered 2023-03-24: 2 mL via INTRAVENOUS

## 2023-05-15 ENCOUNTER — Ambulatory Visit (HOSPITAL_BASED_OUTPATIENT_CLINIC_OR_DEPARTMENT_OTHER): Payer: Commercial Managed Care - HMO | Admitting: Cardiovascular Disease

## 2023-05-16 ENCOUNTER — Telehealth: Payer: Self-pay

## 2023-05-16 ENCOUNTER — Ambulatory Visit: Payer: Commercial Managed Care - HMO | Attending: Internal Medicine | Admitting: Internal Medicine

## 2023-05-16 VITALS — Ht 64.0 in | Wt 187.0 lb

## 2023-05-16 DIAGNOSIS — I480 Paroxysmal atrial fibrillation: Secondary | ICD-10-CM | POA: Diagnosis not present

## 2023-05-16 NOTE — Addendum Note (Signed)
Addended by: Neoma Laming on: 05/16/2023 01:15 PM   Modules accepted: Orders

## 2023-05-16 NOTE — Patient Instructions (Signed)
Medication Instructions:  Continue same medications *If you need a refill on your cardiac medications before your next appointment, please call your pharmacy*   Lab Work: None ordered   Testing/Procedures: None ordered   Follow-Up: At [facility], you and your health needs are our priority.  As part of our continuing mission to provide you with exceptional heart care, we have created designated Provider Care Teams.  These Care Teams include your primary Cardiologist (physician) and Advanced Practice Providers (APPs -  Physician Assistants and Nurse Practitioners) who all work together to provide you with the care you need, when you need it.  We recommend signing up for the patient portal called "MyChart".  Sign up information is provided on this After Visit Summary.  MyChart is used to connect with patients for Virtual Visits (Telemedicine).  Patients are able to view lab/test results, encounter notes, upcoming appointments, etc.  Non-urgent messages can be sent to your provider as well.   To learn more about what you can do with MyChart, go to [email or web address].    Your next appointment:  6 months   Call in April to schedule August appointment     Provider:  Dr.Dublin

## 2023-05-16 NOTE — Progress Notes (Addendum)
 Cardiology Office Note:  .   Date:  05/16/2023  ID:  Lindsey Cardenas, DOB July 17, 1960, MRN [id number] PCP: Irena Reichmann, DO  [facility] Providers Cardiologist:  None    History of Present Illness: .   Lindsey Cardenas is a 63 y.o. female referred here by Dr. Thomasena Edis for Dr. Duke Salvia, she is here for new onset atrial paroxysmal afib. She reported heart racing.  He had a ziopatch for just under 2 weeks. Had <1 % afib. [facility]-She had an echo in 02/2012, normal LV fxn, no significant valve disease. She had a monitor in 2004, had palpitations.   She denies history of sleep apnea.  She denies any signs at this . No heavy etoh use.  Normal renal function  She has hx of thyoid dx TSH 10.5 10/12/2022  No cardiac disease history  ROS:  per HPI otherwise negative  Interim hx 05/16/2023 Virtual Visit. Video Call. [name] is doing well today. She notes occasional skipped beats here and there. She was placed on aspirin and started to have hemorrhoidal bleeding, so this was stopped. Otherwise, she denies SOB or dizziness.   Studies Reviewed: .        Risk Assessment/Calculations:    CHA2DS2-VASc Score = 1   This indicates a 0.6% annual risk of stroke. The patient's score is based upon: CHF History: 0 HTN History: 0 Diabetes History: 0 Stroke History: 0 Vascular Disease History: 0 Age Score: 0 Gender Score: 1          Physical Exam:   VS:    Ht 5\' 4"  (1.626 m)   Wt 187 lb (84.8 kg)   LMP 01/23/2018   BMI 32.10 kg/m    Wt Readings from Last 3 Encounters:  05/16/23 187 lb (84.8 kg)  02/14/23 190 lb 3.2 oz (86.3 kg)  01/01/18 170 lb (77.1 kg)  Well appearing MMM Normal WOB No abdominal distension Normal Affect    ASSESSMENT AND PLAN: .   Paroxysmal atrial fibrillation Suspect in the setting of thyroid disease Normal LV fxn -Cont. metoprolol 25 mg tartrate BID PRN; per patient preference -Her CHA2DS2-VASc is equal to 1; therefore her stroke risk is very low  will not start anticoagulation.; no strong indication for aspirin -We discussed that if she notes heart rates greater than 130 for 24 hours to let us know to be evaluated.  Otherwise we discussed purchasing Kardia mobile.         Thyroid dx hx TSH 6.5; T3/T4 normal  Dispo:  Follow-up in 6 months; was planned to see Dr. Duke Salvia; also recommended Dr. Servando Salina if cannot get a sooner appt.  Signed, Maisie Fus, MD   Virtual Visit via Video Note  I connected with Lindsey Cardenas on 06/02/23 at 10:20 AM EST by a video enabled telemedicine application and verified that I am speaking with the correct person using two identifiers.  I discussed the limitations of evaluation and management by telemedicine and the availability of in person appointments. The patient expressed understanding and agreed to proceed.  Follow Up Instructions:    I discussed the assessment and treatment plan with the patient. The patient was provided an opportunity to ask questions and all were answered. The patient agreed with the plan and demonstrated an understanding of the instructions.   The patient was advised to call back or seek an in-person evaluation if the symptoms worsen or if the condition fails to improve as anticipated.  I provided 15 minutes of non-face-to-face time during  this encounter.

## 2023-05-16 NOTE — Telephone Encounter (Signed)
  Patient Consent for Virtual Visit        Lindsey Cardenas has provided verbal consent on 05/16/2023 for a virtual visit (video or telephone).   CONSENT FOR VIRTUAL VISIT FOR:  Lindsey Cardenas  By participating in this virtual visit I agree to the following:  I hereby voluntarily request, consent and authorize [facility] and its employed or contracted physicians, physician assistants, nurse practitioners or other licensed health care professionals (the Practitioner), to provide me with telemedicine health care services (the "Services") as deemed necessary by the treating Practitioner. I acknowledge and consent to receive the Services by the Practitioner via telemedicine. I understand that the telemedicine visit will involve communicating with the Practitioner through live audiovisual communication technology and the disclosure of certain medical information by electronic transmission. I acknowledge that I have been given the opportunity to request an in-person assessment or other available alternative prior to the telemedicine visit and am voluntarily participating in the telemedicine visit.  I understand that I have the right to withhold or withdraw my consent to the use of telemedicine in the course of my care at any time, without affecting my right to future care or treatment, and that the Practitioner or I may terminate the telemedicine visit at any time. I understand that I have the right to inspect all information obtained and/or recorded in the course of the telemedicine visit and may receive copies of available information for a reasonable fee.  I understand that some of the potential risks of receiving the Services via telemedicine include:  Delay or interruption in medical evaluation due to technological equipment failure or disruption; Information transmitted may not be sufficient (e.g. poor resolution of images) to allow for appropriate medical decision making by the  Practitioner; and/or  In rare instances, security protocols could fail, causing a breach of personal health information.  Furthermore, I acknowledge that it is my responsibility to provide information about my medical history, conditions and care that is complete and accurate to the best of my ability. I acknowledge that Practitioner's advice, recommendations, and/or decision may be based on factors not within their control, such as incomplete or inaccurate data provided by me or distortions of diagnostic images or specimens that may result from electronic transmissions. I understand that the practice of medicine is not an exact science and that Practitioner makes no warranties or guarantees regarding treatment outcomes. I acknowledge that a copy of this consent can be made available to me via my patient portal [facility] MyChart), or I can request a printed copy by calling the office of [facility].    I understand that my insurance will be billed for this visit.   I have read or had this consent read to me. I understand the contents of this consent, which adequately explains the benefits and risks of the Services being provided via telemedicine.  I have been provided ample opportunity to ask questions regarding this consent and the Services and have had my questions answered to my satisfaction. I give my informed consent for the services to be provided through the use of telemedicine in my medical care

## 2023-06-02 NOTE — Addendum Note (Signed)
 Addended byCarolan Clines on: 06/02/2023 10:05 AM   Modules accepted: Level of Service

## 2023-10-05 ENCOUNTER — Other Ambulatory Visit: Payer: Self-pay | Admitting: Obstetrics and Gynecology

## 2023-10-05 ENCOUNTER — Other Ambulatory Visit (HOSPITAL_COMMUNITY)
Admission: RE | Admit: 2023-10-05 | Discharge: 2023-10-05 | Disposition: A | Discharge status: Home / Self Care | Source: Ambulatory Visit | Attending: Obstetrics and Gynecology | Admitting: Obstetrics and Gynecology

## 2023-10-05 DIAGNOSIS — Z01419 Encounter for gynecological examination (general) (routine) without abnormal findings: Secondary | ICD-10-CM | POA: Insufficient documentation

## 2023-10-10 LAB — CYTOLOGY - PAP
Comment: NEGATIVE
Diagnosis: NEGATIVE
High risk HPV: NEGATIVE

## 2023-10-17 ENCOUNTER — Encounter: Payer: Self-pay | Admitting: Podiatry

## 2023-10-17 ENCOUNTER — Ambulatory Visit: Admitting: Podiatry

## 2023-10-17 ENCOUNTER — Telehealth: Payer: Self-pay | Admitting: Lab

## 2023-10-17 DIAGNOSIS — M722 Plantar fascial fibromatosis: Secondary | ICD-10-CM | POA: Diagnosis not present

## 2023-10-17 MED ORDER — TRIAMCINOLONE ACETONIDE 40 MG/ML IJ SUSP
20.0000 mg | Freq: Once | INTRAMUSCULAR | Status: AC
  Administered 2023-10-17: 20 mg

## 2023-10-17 NOTE — Telephone Encounter (Signed)
 Patient has contacted office wanting to have medication that was given at her last visit called in to pharmacy.She was not sure of the medication.

## 2023-10-17 NOTE — Progress Notes (Signed)
 She presents today for follow-up of her left heel pain.  States that she still having pain with the left heel and on the lateral aspect of the foot.  Just realized she has not been taking the meloxicam  and did not remember what it was for.  Objective: Vital signs are stable alert oriented x 3.  Pulses are palpable.  She still has pain on palpation medial calcaneal tubercle of the left heel with a much decrease in firmness from previous evaluation.  She does have pain on palpation fourth fifth tarsometatarsal joint articulation.  Assessment plantar fasciitis with lateral compensatory syndrome.  Plan: I injected the left heel once again today and encouraged her to wear appropriate shoe gear and to start her meloxicam .  I will follow-up with her in 2 months if necessary

## 2023-10-17 NOTE — Addendum Note (Signed)
 Addended by: Alixander Rallis T on: 10/17/2023 02:54 PM   Modules accepted: Orders

## 2023-10-18 ENCOUNTER — Other Ambulatory Visit: Payer: Self-pay | Admitting: Podiatry

## 2023-10-18 MED ORDER — MELOXICAM 15 MG PO TABS: 15.0000 mg | ORAL_TABLET | Freq: Every day | ORAL | 3 refills | Status: AC

## 2023-12-12 ENCOUNTER — Ambulatory Visit: Admitting: Podiatry

## 2024-03-13 ENCOUNTER — Encounter (INDEPENDENT_AMBULATORY_CARE_PROVIDER_SITE_OTHER): Admitting: Ophthalmology

## 2024-03-13 DIAGNOSIS — H35412 Lattice degeneration of retina, left eye: Secondary | ICD-10-CM

## 2024-03-13 DIAGNOSIS — H4312 Vitreous hemorrhage, left eye: Secondary | ICD-10-CM | POA: Diagnosis not present

## 2024-03-13 DIAGNOSIS — I10 Essential (primary) hypertension: Secondary | ICD-10-CM

## 2024-03-13 DIAGNOSIS — H35052 Retinal neovascularization, unspecified, left eye: Secondary | ICD-10-CM | POA: Diagnosis not present

## 2024-03-13 DIAGNOSIS — H35032 Hypertensive retinopathy, left eye: Secondary | ICD-10-CM | POA: Diagnosis not present

## 2024-03-13 DIAGNOSIS — H43813 Vitreous degeneration, bilateral: Secondary | ICD-10-CM

## 2024-05-14 ENCOUNTER — Encounter (INDEPENDENT_AMBULATORY_CARE_PROVIDER_SITE_OTHER): Admitting: Ophthalmology
